# Patient Record
Sex: Female | Born: 1975 | Race: White | Hispanic: No | Marital: Married | Smoking: Never smoker
Health system: Southern US, Community
[De-identification: ages and names within clinical notes are randomized; demographics above are authoritative.]

## PROBLEM LIST (undated history)

## (undated) DIAGNOSIS — I10 Essential (primary) hypertension: Secondary | ICD-10-CM

## (undated) DIAGNOSIS — N970 Female infertility associated with anovulation: Secondary | ICD-10-CM

## (undated) DIAGNOSIS — K635 Polyp of colon: Secondary | ICD-10-CM

## (undated) HISTORY — DX: Female infertility associated with anovulation: N97.0

## (undated) HISTORY — DX: Polyp of colon: K63.5

## (undated) HISTORY — PX: OTHER SURGICAL HISTORY: SHX169

---

## 2006-10-27 ENCOUNTER — Emergency Department (HOSPITAL_COMMUNITY): Admission: EM | Admit: 2006-10-27 | Discharge: 2006-10-27 | Payer: Self-pay | Admitting: Emergency Medicine

## 2010-06-22 LAB — ANTIBODY SCREEN: Antibody Screen: NEGATIVE

## 2010-06-22 LAB — ABO/RH: RH Type: POSITIVE

## 2010-06-22 LAB — GC/CHLAMYDIA PROBE AMP, GENITAL
Chlamydia: NEGATIVE
Gonorrhea: NEGATIVE

## 2010-06-22 LAB — HIV ANTIBODY (ROUTINE TESTING W REFLEX): HIV: NONREACTIVE

## 2010-08-25 ENCOUNTER — Other Ambulatory Visit: Payer: Self-pay | Admitting: Obstetrics and Gynecology

## 2010-08-25 DIAGNOSIS — O09529 Supervision of elderly multigravida, unspecified trimester: Secondary | ICD-10-CM

## 2010-09-17 ENCOUNTER — Ambulatory Visit (HOSPITAL_COMMUNITY): Payer: BC Managed Care – PPO

## 2010-09-17 ENCOUNTER — Encounter (HOSPITAL_COMMUNITY): Payer: BC Managed Care – PPO

## 2010-09-17 ENCOUNTER — Other Ambulatory Visit: Payer: Self-pay | Admitting: Obstetrics and Gynecology

## 2010-09-17 ENCOUNTER — Ambulatory Visit (HOSPITAL_COMMUNITY)
Admission: RE | Admit: 2010-09-17 | Discharge: 2010-09-17 | Disposition: A | Discharge status: Home / Self Care | Payer: BC Managed Care – PPO | Source: Ambulatory Visit | Attending: Obstetrics and Gynecology | Admitting: Obstetrics and Gynecology

## 2010-09-17 ENCOUNTER — Other Ambulatory Visit (HOSPITAL_COMMUNITY): Payer: BC Managed Care – PPO

## 2010-09-17 DIAGNOSIS — Z1389 Encounter for screening for other disorder: Secondary | ICD-10-CM | POA: Insufficient documentation

## 2010-09-17 DIAGNOSIS — Z0489 Encounter for examination and observation for other specified reasons: Secondary | ICD-10-CM

## 2010-09-17 DIAGNOSIS — O358XX0 Maternal care for other (suspected) fetal abnormality and damage, not applicable or unspecified: Secondary | ICD-10-CM | POA: Insufficient documentation

## 2010-09-17 DIAGNOSIS — O09529 Supervision of elderly multigravida, unspecified trimester: Secondary | ICD-10-CM

## 2010-09-17 DIAGNOSIS — Z363 Encounter for antenatal screening for malformations: Secondary | ICD-10-CM | POA: Insufficient documentation

## 2010-09-17 DIAGNOSIS — O99210 Obesity complicating pregnancy, unspecified trimester: Secondary | ICD-10-CM

## 2010-10-15 ENCOUNTER — Other Ambulatory Visit: Payer: Self-pay | Admitting: Obstetrics and Gynecology

## 2010-10-15 ENCOUNTER — Ambulatory Visit (HOSPITAL_COMMUNITY)
Admission: RE | Admit: 2010-10-15 | Discharge: 2010-10-15 | Disposition: A | Discharge status: Home / Self Care | Payer: BC Managed Care – PPO | Source: Ambulatory Visit | Attending: Obstetrics and Gynecology | Admitting: Obstetrics and Gynecology

## 2010-10-15 DIAGNOSIS — E669 Obesity, unspecified: Secondary | ICD-10-CM | POA: Insufficient documentation

## 2010-10-15 DIAGNOSIS — O99210 Obesity complicating pregnancy, unspecified trimester: Secondary | ICD-10-CM

## 2010-10-15 DIAGNOSIS — Z0489 Encounter for examination and observation for other specified reasons: Secondary | ICD-10-CM

## 2010-10-15 DIAGNOSIS — Z3689 Encounter for other specified antenatal screening: Secondary | ICD-10-CM | POA: Insufficient documentation

## 2010-11-05 ENCOUNTER — Other Ambulatory Visit: Payer: Self-pay | Admitting: Obstetrics and Gynecology

## 2010-11-05 ENCOUNTER — Ambulatory Visit (HOSPITAL_COMMUNITY)
Admission: RE | Admit: 2010-11-05 | Discharge: 2010-11-05 | Disposition: A | Discharge status: Home / Self Care | Payer: BC Managed Care – PPO | Source: Ambulatory Visit | Attending: Obstetrics and Gynecology | Admitting: Obstetrics and Gynecology

## 2010-11-05 DIAGNOSIS — Z3689 Encounter for other specified antenatal screening: Secondary | ICD-10-CM | POA: Insufficient documentation

## 2010-12-03 ENCOUNTER — Ambulatory Visit (HOSPITAL_COMMUNITY)
Admission: RE | Admit: 2010-12-03 | Discharge: 2010-12-03 | Disposition: A | Discharge status: Home / Self Care | Payer: BC Managed Care – PPO | Source: Ambulatory Visit | Attending: Obstetrics and Gynecology | Admitting: Obstetrics and Gynecology

## 2010-12-03 ENCOUNTER — Encounter (HOSPITAL_COMMUNITY): Payer: Self-pay

## 2010-12-03 ENCOUNTER — Other Ambulatory Visit: Payer: Self-pay | Admitting: Obstetrics and Gynecology

## 2010-12-03 DIAGNOSIS — Z3689 Encounter for other specified antenatal screening: Secondary | ICD-10-CM | POA: Insufficient documentation

## 2010-12-03 DIAGNOSIS — O99210 Obesity complicating pregnancy, unspecified trimester: Secondary | ICD-10-CM | POA: Insufficient documentation

## 2010-12-03 DIAGNOSIS — E669 Obesity, unspecified: Secondary | ICD-10-CM | POA: Insufficient documentation

## 2011-02-24 ENCOUNTER — Telehealth (HOSPITAL_COMMUNITY): Payer: Self-pay | Admitting: *Deleted

## 2011-02-24 ENCOUNTER — Encounter (HOSPITAL_COMMUNITY): Payer: Self-pay | Admitting: *Deleted

## 2011-02-24 NOTE — Telephone Encounter (Signed)
Preadmission screen  

## 2011-02-25 ENCOUNTER — Encounter (HOSPITAL_COMMUNITY): Payer: Self-pay | Admitting: *Deleted

## 2011-02-27 ENCOUNTER — Encounter (HOSPITAL_COMMUNITY): Payer: Self-pay

## 2011-02-27 ENCOUNTER — Inpatient Hospital Stay (HOSPITAL_COMMUNITY)
Admission: RE | Admit: 2011-02-27 | Discharge: 2011-03-03 | DRG: 371 | Disposition: A | Discharge status: Home / Self Care | Payer: BC Managed Care – PPO | Source: Ambulatory Visit | Attending: Obstetrics and Gynecology | Admitting: Obstetrics and Gynecology

## 2011-02-27 ENCOUNTER — Encounter: Payer: Self-pay | Admitting: Obstetrics and Gynecology

## 2011-02-27 DIAGNOSIS — Z98891 History of uterine scar from previous surgery: Secondary | ICD-10-CM

## 2011-02-27 DIAGNOSIS — O99892 Other specified diseases and conditions complicating childbirth: Secondary | ICD-10-CM | POA: Diagnosis present

## 2011-02-27 DIAGNOSIS — O481 Prolonged pregnancy: Secondary | ICD-10-CM

## 2011-02-27 DIAGNOSIS — O324XX0 Maternal care for high head at term, not applicable or unspecified: Secondary | ICD-10-CM | POA: Diagnosis present

## 2011-02-27 DIAGNOSIS — O09519 Supervision of elderly primigravida, unspecified trimester: Secondary | ICD-10-CM | POA: Diagnosis present

## 2011-02-27 DIAGNOSIS — Z22330 Carrier of Group B streptococcus: Secondary | ICD-10-CM

## 2011-02-27 DIAGNOSIS — O480 Post-term pregnancy: Principal | ICD-10-CM | POA: Diagnosis present

## 2011-02-27 DIAGNOSIS — E669 Obesity, unspecified: Secondary | ICD-10-CM | POA: Diagnosis present

## 2011-02-27 LAB — CBC
Hemoglobin: 12.3 g/dL (ref 12.0–15.0)
MCHC: 32.6 g/dL (ref 30.0–36.0)
RDW: 14.4 % (ref 11.5–15.5)
WBC: 8.9 10*3/uL (ref 4.0–10.5)

## 2011-02-27 MED ORDER — TERBUTALINE SULFATE 1 MG/ML IJ SOLN: 0.2500 mg | Freq: Once | INTRAMUSCULAR | Status: AC | PRN

## 2011-02-27 MED ORDER — OXYTOCIN 20 UNITS IN LACTATED RINGERS INFUSION - SIMPLE
1.0000 m[IU]/min | INTRAVENOUS | Status: DC
  Administered 2011-02-28: 2 m[IU]/min via INTRAVENOUS
  Administered 2011-02-28: 6 m[IU]/min via INTRAVENOUS
  Administered 2011-02-28: 8 m[IU]/min via INTRAVENOUS
  Filled 2011-02-27: qty 1000

## 2011-02-27 MED ORDER — CITRIC ACID-SODIUM CITRATE 334-500 MG/5ML PO SOLN
30.0000 mL | ORAL | Status: DC | PRN
  Administered 2011-02-28: 30 mL via ORAL
  Filled 2011-02-27: qty 15

## 2011-02-27 MED ORDER — LIDOCAINE HCL (PF) 1 % IJ SOLN
30.0000 mL | INTRAMUSCULAR | Status: DC | PRN
  Filled 2011-02-27: qty 30

## 2011-02-27 MED ORDER — ZOLPIDEM TARTRATE 10 MG PO TABS
10.0000 mg | ORAL_TABLET | Freq: Every evening | ORAL | Status: DC | PRN
  Administered 2011-02-27: 10 mg via ORAL
  Filled 2011-02-27: qty 1

## 2011-02-27 MED ORDER — LACTATED RINGERS IV SOLN
INTRAVENOUS | Status: DC
  Administered 2011-02-27 – 2011-02-28 (×3): via INTRAVENOUS
  Administered 2011-02-28: 999 mL/h via INTRAVENOUS
  Administered 2011-02-28: 22:00:00 via INTRAVENOUS
  Administered 2011-02-28: 300 mL via INTRAVENOUS
  Administered 2011-02-28: 05:00:00 via INTRAVENOUS

## 2011-02-27 MED ORDER — HYDROXYZINE HCL 50 MG/ML IM SOLN
50.0000 mg | Freq: Four times a day (QID) | INTRAMUSCULAR | Status: DC | PRN
  Filled 2011-02-27: qty 1

## 2011-02-27 MED ORDER — ONDANSETRON HCL 4 MG/2ML IJ SOLN: 4.0000 mg | Freq: Four times a day (QID) | INTRAMUSCULAR | Status: DC | PRN

## 2011-02-27 MED ORDER — BUTORPHANOL TARTRATE 2 MG/ML IJ SOLN: 1.0000 mg | INTRAMUSCULAR | Status: DC | PRN

## 2011-02-27 MED ORDER — ACETAMINOPHEN 325 MG PO TABS: 650.0000 mg | ORAL_TABLET | ORAL | Status: DC | PRN

## 2011-02-27 MED ORDER — OXYTOCIN 20 UNITS IN LACTATED RINGERS INFUSION - SIMPLE: 125.0000 mL/h | Freq: Once | INTRAVENOUS | Status: DC

## 2011-02-27 MED ORDER — LACTATED RINGERS IV SOLN: 500.0000 mL | INTRAVENOUS | Status: DC | PRN

## 2011-02-27 MED ORDER — PENICILLIN G POTASSIUM 5000000 UNITS IJ SOLR
5.0000 10*6.[IU] | Freq: Once | INTRAVENOUS | Status: AC
  Administered 2011-02-27: 5 10*6.[IU] via INTRAVENOUS
  Filled 2011-02-27: qty 5

## 2011-02-27 MED ORDER — IBUPROFEN 600 MG PO TABS: 600.0000 mg | ORAL_TABLET | Freq: Four times a day (QID) | ORAL | Status: DC | PRN

## 2011-02-27 MED ORDER — OXYCODONE-ACETAMINOPHEN 5-325 MG PO TABS: 2.0000 | ORAL_TABLET | ORAL | Status: DC | PRN

## 2011-02-27 MED ORDER — OXYTOCIN BOLUS FROM INFUSION
500.0000 mL | Freq: Once | INTRAVENOUS | Status: DC
  Filled 2011-02-27: qty 500

## 2011-02-27 MED ORDER — FLEET ENEMA 7-19 GM/118ML RE ENEM: 1.0000 | ENEMA | RECTAL | Status: DC | PRN

## 2011-02-27 MED ORDER — PENICILLIN G POTASSIUM 5000000 UNITS IJ SOLR
2.5000 10*6.[IU] | INTRAVENOUS | Status: DC
  Administered 2011-02-28 (×5): 2.5 10*6.[IU] via INTRAVENOUS
  Filled 2011-02-27 (×10): qty 2.5

## 2011-02-27 MED ORDER — HYDROXYZINE HCL 50 MG PO TABS
50.0000 mg | ORAL_TABLET | Freq: Four times a day (QID) | ORAL | Status: DC | PRN
  Filled 2011-02-27: qty 1

## 2011-02-27 NOTE — H&P (Signed)
Kristina Burgess is an 35 y.o. female. G1P0, EDC 10/5.  Prenatal course noted for AMA, clomid conception, obesity and positive GBS. Weekly NSTs were done until last week when it became impossible to monitor the baby externally due to the enlarged abdomen.  A biophysical profile done on 10/11 was 8/8.  EFW was at the 38th percentile and was 7lb and 7oz.  Pertinent Gynecological History: Menses: Flow is moderate and occurs every 35-42 days. Bleeding: normal Contraception: none DES exposure: unknown Blood transfusions: none Sexually transmitted diseases: no past history Previous GYN Procedures: none  Last mammogram: na Date: na Last pap: normal Date: 04/15/2009 OB History: G1, P0   Menstrual History: Menarche age: 54 Patient's last menstrual period was 05/14/2010.    Past Medical History  Diagnosis Date  . Colon polyp     removed 4-5 years ago  . Infertility associated with anovulation     was on clomid    Past Surgical History  Procedure Date  . Gi polyp removal     4-5 years ago    Family History  Problem Relation Age of Onset  . Heart disease Paternal Grandmother     pacemaker  . Anesthesia problems Neg Hx   . Hypotension Neg Hx   . Malignant hyperthermia Neg Hx   . Pseudochol deficiency Neg Hx     Social History:  reports that she has never smoked. She has never used smokeless tobacco. She reports that she does not drink alcohol or use illicit drugs.  Allergies: No Known Allergies   (Not in a hospital admission)  Review of Systems  Constitutional: Negative.   HENT: Negative.   Eyes: Negative.   Respiratory: Negative.   Cardiovascular: Negative.   Gastrointestinal: Negative.   Genitourinary: Negative.   Musculoskeletal: Negative.   Skin: Negative.   Neurological: Negative.   Endo/Heme/Allergies: Negative.   Psychiatric/Behavioral: Negative.     Last menstrual period 05/14/2010. Physical Exam  Constitutional: She appears well-developed and well-nourished.  No distress.  HENT:  Head: Normocephalic.  Eyes: Right eye exhibits no discharge. Left eye exhibits no discharge. No scleral icterus.  Neck: Normal range of motion. No tracheal deviation present. No thyromegaly present.  Cardiovascular: Normal rate, regular rhythm, normal heart sounds and intact distal pulses.  Exam reveals no gallop and no friction rub.   No murmur heard. Respiratory: Effort normal and breath sounds normal. No respiratory distress. She has no wheezes. She has no rales. She exhibits no tenderness.  GI: Soft. Bowel sounds are normal. She exhibits no distension. There is no tenderness. There is no rebound and no guarding.       Gravid uterus, term  Genitourinary: Vagina normal. No vaginal discharge found.  Musculoskeletal: Normal range of motion. She exhibits no edema and no tenderness.  Lymphadenopathy:    She has no cervical adenopathy.  Neurological: She is alert. She has normal reflexes.  Skin: Skin is warm and dry.  Psychiatric: She has a normal mood and affect. Her behavior is normal. Judgment and thought content normal.    No results found for this or any previous visit (from the past 24 hour(s)).  No results found.  Assessment/Plan:  41 2/7 weeks AMA Obesity Positive GBS Clomid conception  Pt requests trial of labor due to postdates. I have discussed the risks, benefits, possible complications including increased Cesarean section risks.  Informed consent given for pitocin induction.  Plan foley balloon insertion to ripen cervix.    Fortino Sic 02/27/2011, 8:22 PM

## 2011-02-28 ENCOUNTER — Encounter (HOSPITAL_COMMUNITY): Payer: Self-pay | Admitting: Anesthesiology

## 2011-02-28 ENCOUNTER — Encounter (HOSPITAL_COMMUNITY)
Admission: RE | Disposition: A | Discharge status: Home / Self Care | Payer: Self-pay | Source: Ambulatory Visit | Attending: Obstetrics and Gynecology

## 2011-02-28 ENCOUNTER — Inpatient Hospital Stay (HOSPITAL_COMMUNITY): Payer: BC Managed Care – PPO | Admitting: Anesthesiology

## 2011-02-28 ENCOUNTER — Encounter (HOSPITAL_COMMUNITY): Payer: Self-pay

## 2011-02-28 DIAGNOSIS — O480 Post-term pregnancy: Secondary | ICD-10-CM

## 2011-02-28 LAB — TYPE AND SCREEN
ABO/RH(D): O POS
Antibody Screen: NEGATIVE

## 2011-02-28 LAB — ABO/RH: ABO/RH(D): O POS

## 2011-02-28 LAB — RPR: RPR Ser Ql: NONREACTIVE

## 2011-02-28 SURGERY — Surgical Case
Anesthesia: Epidural | Site: Abdomen | Wound class: Clean Contaminated

## 2011-02-28 MED ORDER — MORPHINE SULFATE (PF) 0.5 MG/ML IJ SOLN
INTRAMUSCULAR | Status: DC | PRN
  Administered 2011-02-28: 4 mg via EPIDURAL
  Administered 2011-02-28: 1 mg via EPIDURAL

## 2011-02-28 MED ORDER — SCOPOLAMINE 1 MG/3DAYS TD PT72
1.0000 | MEDICATED_PATCH | Freq: Once | TRANSDERMAL | Status: DC
  Administered 2011-02-28: 1.5 mg via TRANSDERMAL

## 2011-02-28 MED ORDER — LIDOCAINE HCL 1.5 % IJ SOLN
INTRAMUSCULAR | Status: DC | PRN
  Administered 2011-02-28: 2 mL via INTRADERMAL
  Administered 2011-02-28 (×2): 5 mL via INTRADERMAL

## 2011-02-28 MED ORDER — LIDOCAINE-EPINEPHRINE (PF) 2 %-1:200000 IJ SOLN
INTRAMUSCULAR | Status: AC
  Filled 2011-02-28: qty 20

## 2011-02-28 MED ORDER — LACTATED RINGERS IV SOLN: 500.0000 mL | Freq: Once | INTRAVENOUS | Status: DC

## 2011-02-28 MED ORDER — EPHEDRINE 5 MG/ML INJ
10.0000 mg | INTRAVENOUS | Status: DC | PRN
  Filled 2011-02-28: qty 4

## 2011-02-28 MED ORDER — OXYTOCIN 20 UNITS IN LACTATED RINGERS INFUSION - SIMPLE
INTRAVENOUS | Status: AC
  Filled 2011-02-28: qty 1000

## 2011-02-28 MED ORDER — METOCLOPRAMIDE HCL 5 MG/ML IJ SOLN: 10.0000 mg | Freq: Once | INTRAMUSCULAR | Status: AC | PRN

## 2011-02-28 MED ORDER — MORPHINE SULFATE 0.5 MG/ML IJ SOLN
INTRAMUSCULAR | Status: AC
  Filled 2011-02-28: qty 10

## 2011-02-28 MED ORDER — FENTANYL CITRATE 0.05 MG/ML IJ SOLN: 25.0000 ug | INTRAMUSCULAR | Status: DC | PRN

## 2011-02-28 MED ORDER — FENTANYL 2.5 MCG/ML BUPIVACAINE 1/10 % EPIDURAL INFUSION (WH - ANES)
14.0000 mL/h | INTRAMUSCULAR | Status: DC
  Administered 2011-02-28 (×3): 14 mL/h via EPIDURAL
  Filled 2011-02-28 (×3): qty 60

## 2011-02-28 MED ORDER — CEFAZOLIN SODIUM 1-5 GM-% IV SOLN
INTRAVENOUS | Status: AC
  Filled 2011-02-28: qty 100

## 2011-02-28 MED ORDER — PHENYLEPHRINE 40 MCG/ML (10ML) SYRINGE FOR IV PUSH (FOR BLOOD PRESSURE SUPPORT)
80.0000 ug | PREFILLED_SYRINGE | INTRAVENOUS | Status: DC | PRN
  Filled 2011-02-28 (×2): qty 5

## 2011-02-28 MED ORDER — DIPHENHYDRAMINE HCL 50 MG/ML IJ SOLN: 12.5000 mg | INTRAMUSCULAR | Status: DC | PRN

## 2011-02-28 MED ORDER — OXYTOCIN 10 UNIT/ML IJ SOLN
INTRAMUSCULAR | Status: AC
  Filled 2011-02-28: qty 2

## 2011-02-28 MED ORDER — SODIUM BICARBONATE 8.4 % IV SOLN
INTRAVENOUS | Status: DC | PRN
  Administered 2011-02-28: 10 mL via EPIDURAL

## 2011-02-28 MED ORDER — CEFAZOLIN SODIUM-DEXTROSE 2-3 GM-% IV SOLR
2.0000 g | Freq: Once | INTRAVENOUS | Status: AC
  Administered 2011-02-28: 2 g via INTRAVENOUS

## 2011-02-28 MED ORDER — SODIUM BICARBONATE 8.4 % IV SOLN
INTRAVENOUS | Status: AC
  Filled 2011-02-28: qty 50

## 2011-02-28 MED ORDER — SCOPOLAMINE 1 MG/3DAYS TD PT72
MEDICATED_PATCH | TRANSDERMAL | Status: AC
  Filled 2011-02-28: qty 1

## 2011-02-28 MED ORDER — KETOROLAC TROMETHAMINE 60 MG/2ML IM SOLN
60.0000 mg | Freq: Once | INTRAMUSCULAR | Status: AC | PRN
  Administered 2011-02-28: 60 mg via INTRAMUSCULAR

## 2011-02-28 MED ORDER — PHENYLEPHRINE HCL 10 MG/ML IJ SOLN
INTRAMUSCULAR | Status: DC | PRN
  Administered 2011-02-28: 40 ug via INTRAVENOUS

## 2011-02-28 MED ORDER — KETOROLAC TROMETHAMINE 60 MG/2ML IM SOLN
INTRAMUSCULAR | Status: AC
  Filled 2011-02-28: qty 2

## 2011-02-28 MED ORDER — ONDANSETRON HCL 4 MG/2ML IJ SOLN
INTRAMUSCULAR | Status: AC
Start: 2011-02-28 — End: 2011-02-28
  Filled 2011-02-28: qty 2

## 2011-02-28 MED ORDER — ONDANSETRON HCL 4 MG/2ML IJ SOLN
INTRAMUSCULAR | Status: DC | PRN
  Administered 2011-02-28: 4 mg via INTRAVENOUS

## 2011-02-28 MED ORDER — PHENYLEPHRINE 40 MCG/ML (10ML) SYRINGE FOR IV PUSH (FOR BLOOD PRESSURE SUPPORT)
PREFILLED_SYRINGE | INTRAVENOUS | Status: AC
  Filled 2011-02-28: qty 5

## 2011-02-28 MED ORDER — OXYTOCIN 20 UNITS IN LACTATED RINGERS INFUSION - SIMPLE
125.0000 mL/h | INTRAVENOUS | Status: AC
  Administered 2011-02-28: 125 mL/h via INTRAVENOUS

## 2011-02-28 MED ORDER — EPHEDRINE 5 MG/ML INJ
10.0000 mg | INTRAVENOUS | Status: DC | PRN
  Filled 2011-02-28 (×2): qty 4

## 2011-02-28 MED ORDER — PHENYLEPHRINE 40 MCG/ML (10ML) SYRINGE FOR IV PUSH (FOR BLOOD PRESSURE SUPPORT)
80.0000 ug | PREFILLED_SYRINGE | INTRAVENOUS | Status: DC | PRN
  Administered 2011-02-28 (×2): 80 ug via INTRAVENOUS
  Filled 2011-02-28: qty 5

## 2011-02-28 MED ORDER — MEPERIDINE HCL 25 MG/ML IJ SOLN
6.2500 mg | INTRAMUSCULAR | Status: DC | PRN
  Administered 2011-03-01: 12.5 mg via INTRAVENOUS

## 2011-02-28 MED ORDER — OXYTOCIN 20 UNITS IN LACTATED RINGERS INFUSION - SIMPLE
INTRAVENOUS | Status: DC | PRN
  Administered 2011-02-28 (×2): 20 [IU] via INTRAVENOUS

## 2011-02-28 SURGICAL SUPPLY — 35 items
ADH SKN CLS APL DERMABOND .7 (GAUZE/BANDAGES/DRESSINGS)
CLOTH BEACON ORANGE TIMEOUT ST (SAFETY) ×2 IMPLANT
DERMABOND ADVANCED (GAUZE/BANDAGES/DRESSINGS)
DERMABOND ADVANCED .7 DNX12 (GAUZE/BANDAGES/DRESSINGS) IMPLANT
DRESSING TELFA 8X3 (GAUZE/BANDAGES/DRESSINGS) ×1 IMPLANT
DRSG PAD ABDOMINAL 8X10 ST (GAUZE/BANDAGES/DRESSINGS) ×1 IMPLANT
DURAPREP 26ML APPLICATOR (WOUND CARE) ×2 IMPLANT
ELECT REM PT RETURN 9FT ADLT (ELECTROSURGICAL) ×2
ELECTRODE REM PT RTRN 9FT ADLT (ELECTROSURGICAL) ×1 IMPLANT
EXTRACTOR VACUUM M CUP 4 TUBE (SUCTIONS) IMPLANT
GAUZE SPONGE 4X4 12PLY STRL LF (GAUZE/BANDAGES/DRESSINGS) IMPLANT
GLOVE BIO SURGEON STRL SZ 6.5 (GLOVE) ×2 IMPLANT
GLOVE BIOGEL PI IND STRL 7.0 (GLOVE) ×1 IMPLANT
GLOVE BIOGEL PI INDICATOR 7.0 (GLOVE) ×1
GOWN PREVENTION PLUS LG XLONG (DISPOSABLE) ×3 IMPLANT
GOWN PREVENTION PLUS XLARGE (GOWN DISPOSABLE) ×3 IMPLANT
KIT ABG SYR 3ML LUER SLIP (SYRINGE) IMPLANT
NDL HYPO 25X5/8 SAFETYGLIDE (NEEDLE) IMPLANT
NEEDLE HYPO 25X5/8 SAFETYGLIDE (NEEDLE) IMPLANT
NS IRRIG 1000ML POUR BTL (IV SOLUTION) ×2 IMPLANT
PACK C SECTION WH (CUSTOM PROCEDURE TRAY) ×2 IMPLANT
PAD ABD 7.5X8 STRL (GAUZE/BANDAGES/DRESSINGS) IMPLANT
RTRCTR C-SECT PINK 25CM LRG (MISCELLANEOUS) IMPLANT
SLEEVE SCD COMPRESS KNEE MED (MISCELLANEOUS) IMPLANT
SPONGE GAUZE 4X4 12PLY (GAUZE/BANDAGES/DRESSINGS) ×2 IMPLANT
SUT CHROMIC 2 0 CT 1 (SUTURE) ×2 IMPLANT
SUT PLAIN 2 0 (SUTURE)
SUT PLAIN 2 0 XLH (SUTURE) ×2 IMPLANT
SUT PLAIN ABS 2-0 54XMFL TIE (SUTURE) IMPLANT
SUT VIC AB 0 CT1 36 (SUTURE) ×8 IMPLANT
SUT VIC AB 4-0 KS 27 (SUTURE) ×2 IMPLANT
TAPE CLOTH SURG 4X10 WHT LF (GAUZE/BANDAGES/DRESSINGS) ×1 IMPLANT
TOWEL OR 17X24 6PK STRL BLUE (TOWEL DISPOSABLE) ×4 IMPLANT
TRAY FOLEY CATH 14FR (SET/KITS/TRAYS/PACK) IMPLANT
WATER STERILE IRR 1000ML POUR (IV SOLUTION) ×1 IMPLANT

## 2011-02-28 NOTE — Anesthesia Postprocedure Evaluation (Signed)
  Anesthesia Post-op Note  Patient: Kristina Burgess  Procedure(s) Performed:  CESAREAN SECTION  Patient Location: PACU  Anesthesia Type: Epidural  Level of Consciousness: awake, alert  and oriented  Airway and Oxygen Therapy: Patient Spontanous Breathing  Post-op Pain: none  Post-op Assessment: Post-op Vital signs reviewed, Patient's Cardiovascular Status Stable, Respiratory Function Stable, Patent Airway, No signs of Nausea or vomiting, Pain level controlled, No headache, No backache, No residual numbness and No residual motor weakness  Post-op Vital Signs: Reviewed and stable  Complications: No apparent anesthesia complications

## 2011-02-28 NOTE — Anesthesia Postprocedure Evaluation (Signed)
  Anesthesia Post-op Note  Patient: Kristina Burgess  Procedure(s) Performed: Patient for urgent Cesarean Section for failure to descend. Taken from Rm 160 to OR 1 for procedure.

## 2011-02-28 NOTE — Anesthesia Preprocedure Evaluation (Addendum)
Anesthesia Evaluation  Name, MR# and DOB Patient awake  General Assessment Comment  Reviewed: Allergy & Precautions, H&P , NPO status , Patient's Chart, lab work & pertinent test results, reviewed documented beta blocker date and time   History of Anesthesia Complications Negative for: history of anesthetic complications  Airway Mallampati: III TM Distance: >3 FB Neck ROM: full    Dental  (+) Teeth Intact   Pulmonary  clear to auscultation        Cardiovascular regular Normal    Neuro/Psych Negative Neurological ROS  Negative Psych ROS   GI/Hepatic negative GI ROS Neg liver ROS    Endo/Other  Morbid obesity  Renal/GU negative Renal ROS     Musculoskeletal   Abdominal   Peds  Hematology negative hematology ROS (+)   Anesthesia Other Findings   Reproductive/Obstetrics (+) Pregnancy                           Anesthesia Physical Anesthesia Plan  ASA: III and Emergent  Anesthesia Plan: Epidural   Post-op Pain Management:    Induction:   Airway Management Planned:   Additional Equipment:   Intra-op Plan:   Post-operative Plan:   Informed Consent: I have reviewed the patients History and Physical, chart, labs and discussed the procedure including the risks, benefits and alternatives for the proposed anesthesia with the patient or authorized representative who has indicated his/her understanding and acceptance.     Plan Discussed with: CRNA, Anesthesiologist and Surgeon  Anesthesia Plan Comments:        Anesthesia Quick Evaluation

## 2011-02-28 NOTE — Transfer of Care (Signed)
Immediate Anesthesia Transfer of Care Note  Patient: Kristina Burgess  Procedure(s) Performed:  CESAREAN SECTION  Patient Location: PACU  Anesthesia Type: Epidural  Level of Consciousness: awake, alert , oriented and patient cooperative  Airway & Oxygen Therapy: Patient Spontanous Breathing  Post-op Assessment: Report given to PACU RN and Post -op Vital signs reviewed and stable  Post vital signs: Reviewed and stable  Complications: No apparent anesthesia complications

## 2011-02-28 NOTE — Progress Notes (Signed)
Labor Note:    Patient is beginning to have increased pain with contractions.  Has been on PCN, s/p 3+ doses of PCN.  Presented for induction of labor at 41+ weeks.  No other complaints.    AFVSS 4/60/-3.  Head well applied to cervix.  Not ballotable. Reactive and reassuring fetal status. Toco:  q 2 min.  AROM:  After vaginal exam was performed revealed the above findings, AROM was performed.  After the [facility] was used to rupture membranes, fundal pressure used to ensure continued application of head on cervix.  Smell amount of meconium stained fluid was obtained.  Fetus tolerated the procedure well.  Thereafter, an IUPC was placed.  Several contractions viewed and MVU's appeared to be appropriate on 20 mu's of pitocin.  Will continue to monitor.  A/P:  Induc. Of labor in morbidly obese female with post-dates pregnancy.  Continue present expectant management.

## 2011-02-28 NOTE — Anesthesia Procedure Notes (Addendum)
Epidural Patient location during procedure: OB Start time: 02/28/2011 1:57 PM Reason for block: procedure for pain  Staffing Performed by: anesthesiologist   Preanesthetic Checklist Completed: patient identified, site marked, surgical consent, pre-op evaluation, timeout performed, IV checked, risks and benefits discussed and monitors and equipment checked  Epidural Patient position: sitting Prep: site prepped and draped and DuraPrep Patient monitoring: continuous pulse ox and blood pressure Approach: midline Injection technique: LOR air  Needle:  Needle type: Tuohy  Needle gauge: 17 G Needle length: 9 cm Needle insertion depth: 9 cm Catheter type: closed end flexible Catheter size: 19 Gauge Catheter at skin depth: 14 cm Test dose: negative  Assessment Events: blood not aspirated, injection not painful, no injection resistance, negative IV test and no paresthesia  Additional Notes Discussed risk of headache, infection, bleeding, nerve injury and failed or incomplete block.  Patient voices understanding and wishes to proceed.

## 2011-02-28 NOTE — Progress Notes (Signed)
Labor Note:   Patient is comfortable with epidural.  No complaints.  Patient had decels and hypotension s/p epidural placement and pitocin was turned off.  Nurse later restarted pitocin and has been increasing the dose incrementally since, now at 8 mu.  MVU's were adequate s/p AROM and prior to epidural and at that time, there was cervical change.  Since then, however, not adequate and also no cervical change.  MVU's currently at 170-180.  Will continue to increase pitocin in order to obtain adequate MVU's.    AFVSS. 5/80/-2/mild caput. Toco:  q 2-3 minutes.   + accels, no current decels, + variability.  A/P:  Continue induction with pitocin for postdates.  Unable to diagnose arrest disorder at present given no extended time with adequate ctx.  Mild caput and lack of cervical change, however, concerning for impending arrest.  This was discussed with patient.  Continue PCN for + GBS.

## 2011-02-28 NOTE — Anesthesia Preprocedure Evaluation (Signed)
Anesthesia Evaluation  Name, MR# and DOB Patient awake  General Assessment Comment  Reviewed: Allergy & Precautions, H&P , NPO status , Patient's Chart, lab work & pertinent test results, reviewed documented beta blocker date and time   History of Anesthesia Complications Negative for: history of anesthetic complications  Airway Mallampati: III TM Distance: >3 FB Neck ROM: full    Dental  (+) Teeth Intact   Pulmonary  clear to auscultation  Pulmonary exam normal       Cardiovascular regular Normal    Neuro/Psych Negative Neurological ROS  Negative Psych ROS   GI/Hepatic negative GI ROS Neg liver ROS    Endo/Other  Morbid obesity  Renal/GU negative Renal ROS     Musculoskeletal   Abdominal   Peds  Hematology negative hematology ROS (+)   Anesthesia Other Findings   Reproductive/Obstetrics (+) Pregnancy                           Anesthesia Physical  Anesthesia Plan  ASA: III and Emergent  Anesthesia Plan: Epidural   Post-op Pain Management:    Induction:   Airway Management Planned:   Additional Equipment:   Intra-op Plan:   Post-operative Plan:   Informed Consent: I have reviewed the patients History and Physical, chart, labs and discussed the procedure including the risks, benefits and alternatives for the proposed anesthesia with the patient or authorized representative who has indicated his/her understanding and acceptance.     Plan Discussed with: CRNA, Anesthesiologist and Surgeon  Anesthesia Plan Comments:         Anesthesia Quick Evaluation

## 2011-02-28 NOTE — Op Note (Signed)
Cesarean Section Procedure Note   Kristina Burgess   02/28/2011  Indications: 42 weeks, failed induction, failure to descend, failure to progress positive GBS, maternal obesity  35 yo G1, induced due to post dates.  Prenatal care noted for clomid conception.  Induction was started with a foley balloon insertion into the cervix followed by pitocin induction.  Labor progressed slowly and pitocin had to be stopped due to late decelerations which recovered when the pitocin was stopped.  Cervix progressed to 7cm dilation but the head did not descend past minus two.  The situation was discussed with the patient and her partner and informed consent was given for Cesarean section.  Pre-operative Diagnosis: Failure to Progress.   Post-operative Diagnosis: Same   Surgeon: Fortino Sic  Assistants: none  Anesthesia: epidural  Procedure Details:  The patient was seen in the Holding Room. The risks, benefits, complications, treatment options, and expected outcomes were discussed with the patient. The patient concurred with the proposed plan, giving informed consent. The patient was identified as [name] and the procedure verified as C-Section Delivery. A Time Out was held and the above information confirmed.  After induction of anesthesia, the patient was draped and prepped in the usual sterile manner. A transverse incision was made and carried down through the subcutaneous tissue to the fascia. The fascial incision was made and extended transversely. The fascia was separated from the underlying rectus tissue superiorly and inferiorly. The peritoneum was identified and entered. The peritoneal incision was extended longitudinally. The utero-vesical peritoneal reflection was incised transversely and the bladder flap was bluntly freed from the lower uterine segment. A low transverse uterine incision was made. Delivered from cephalic presentation was a 7 lb 6 oz female living newborn s) with Apgar scores of 8  and 9 at onr minute and 5 minutes.. A cord ph was  not sent. The umbilical cord was clamped and cut cord. A sample was obtained for evaluation. The placenta was removed Intact and appeared normal.  The uterine incision was closed with running locked sutures of 1-0 vicryl. A second imbricating layer of the same suture was placed.  Hemostasis was observed. The paracolic gutters were irrigated. The fascia was then re approximated with running sutures of 1-vicryl. The subcuticular closure was performed using 2-0 plain. The skin was approximated with 4 0 vicryl in a subcuticular fashion.  Instrument, sponge, and needle counts were correct prior the abdominal closure and were correct at the conclusion of the case.    Findings:  viable female infant   Estimated Blood Loss: 750 cc   Total IV Fluids: see anesthesia record  Urine Output:  See anesthesia record  Specimens: Placenta to Labor and delivery Specimens    None       Complications: no complications  Disposition: PACU - hemodynamically stable.  Maternal Condition: stable   Baby condition / location:  nursery-stable    Signed: Surgeon(s): Fortino Sic, MD

## 2011-03-01 LAB — CBC
HCT: 32 % — ABNORMAL LOW (ref 36.0–46.0)
MCV: 89.1 fL (ref 78.0–100.0)
RDW: 14.3 % (ref 11.5–15.5)
WBC: 12.4 10*3/uL — ABNORMAL HIGH (ref 4.0–10.5)

## 2011-03-01 MED ORDER — MEASLES, MUMPS & RUBELLA VAC ~~LOC~~ INJ
0.5000 mL | INJECTION | Freq: Once | SUBCUTANEOUS | Status: DC
  Filled 2011-03-01: qty 0.5

## 2011-03-01 MED ORDER — SODIUM CHLORIDE 0.9 % IV SOLN
1.0000 ug/kg/h | INTRAVENOUS | Status: DC | PRN
  Filled 2011-03-01: qty 2.5

## 2011-03-01 MED ORDER — DIPHENHYDRAMINE HCL 50 MG/ML IJ SOLN: 12.5000 mg | INTRAMUSCULAR | Status: DC | PRN

## 2011-03-01 MED ORDER — BUPIVACAINE ON-Q PAIN PUMP (FOR ORDER SET NO CHG): INJECTION | Status: DC

## 2011-03-01 MED ORDER — ZOLPIDEM TARTRATE 5 MG PO TABS: 5.0000 mg | ORAL_TABLET | Freq: Every evening | ORAL | Status: DC | PRN

## 2011-03-01 MED ORDER — NALBUPHINE SYRINGE 5 MG/0.5 ML
5.0000 mg | INJECTION | INTRAMUSCULAR | Status: DC | PRN
  Filled 2011-03-01: qty 1

## 2011-03-01 MED ORDER — SIMETHICONE 80 MG PO CHEW: 80.0000 mg | CHEWABLE_TABLET | ORAL | Status: DC | PRN

## 2011-03-01 MED ORDER — DIBUCAINE 1 % RE OINT: 1.0000 "application " | TOPICAL_OINTMENT | RECTAL | Status: DC | PRN

## 2011-03-01 MED ORDER — ONDANSETRON HCL 4 MG/2ML IJ SOLN: 4.0000 mg | INTRAMUSCULAR | Status: DC | PRN

## 2011-03-01 MED ORDER — ONDANSETRON HCL 4 MG/2ML IJ SOLN: 4.0000 mg | Freq: Three times a day (TID) | INTRAMUSCULAR | Status: DC | PRN

## 2011-03-01 MED ORDER — KETOROLAC TROMETHAMINE 30 MG/ML IJ SOLN: 30.0000 mg | Freq: Four times a day (QID) | INTRAMUSCULAR | Status: AC | PRN

## 2011-03-01 MED ORDER — DIPHENHYDRAMINE HCL 25 MG PO CAPS: 25.0000 mg | ORAL_CAPSULE | Freq: Four times a day (QID) | ORAL | Status: DC | PRN

## 2011-03-01 MED ORDER — MEPERIDINE HCL 25 MG/ML IJ SOLN: 6.2500 mg | INTRAMUSCULAR | Status: DC | PRN

## 2011-03-01 MED ORDER — LACTATED RINGERS IV SOLN
INTRAVENOUS | Status: DC
  Administered 2011-03-01: 07:00:00 via INTRAVENOUS

## 2011-03-01 MED ORDER — OXYCODONE-ACETAMINOPHEN 5-325 MG PO TABS: 1.0000 | ORAL_TABLET | ORAL | Status: DC | PRN

## 2011-03-01 MED ORDER — ONDANSETRON HCL 4 MG PO TABS: 4.0000 mg | ORAL_TABLET | ORAL | Status: DC | PRN

## 2011-03-01 MED ORDER — IBUPROFEN 600 MG PO TABS: 600.0000 mg | ORAL_TABLET | Freq: Four times a day (QID) | ORAL | Status: DC | PRN

## 2011-03-01 MED ORDER — MENTHOL 3 MG MT LOZG: 1.0000 | LOZENGE | OROMUCOSAL | Status: DC | PRN

## 2011-03-01 MED ORDER — MEDROXYPROGESTERONE ACETATE 150 MG/ML IM SUSP: 150.0000 mg | INTRAMUSCULAR | Status: DC | PRN

## 2011-03-01 MED ORDER — WITCH HAZEL-GLYCERIN EX PADS: 1.0000 "application " | MEDICATED_PAD | CUTANEOUS | Status: DC | PRN

## 2011-03-01 MED ORDER — IBUPROFEN 600 MG PO TABS
600.0000 mg | ORAL_TABLET | Freq: Four times a day (QID) | ORAL | Status: DC
  Administered 2011-03-01 – 2011-03-03 (×7): 600 mg via ORAL
  Filled 2011-03-01 (×8): qty 1

## 2011-03-01 MED ORDER — LANOLIN HYDROUS EX OINT: 1.0000 "application " | TOPICAL_OINTMENT | CUTANEOUS | Status: DC | PRN

## 2011-03-01 MED ORDER — TETANUS-DIPHTH-ACELL PERTUSSIS 5-2.5-18.5 LF-MCG/0.5 IM SUSP
0.5000 mL | Freq: Once | INTRAMUSCULAR | Status: AC
  Administered 2011-03-02: 0.5 mL via INTRAMUSCULAR
  Filled 2011-03-01: qty 0.5

## 2011-03-01 MED ORDER — DIPHENHYDRAMINE HCL 50 MG/ML IJ SOLN: 25.0000 mg | INTRAMUSCULAR | Status: DC | PRN

## 2011-03-01 MED ORDER — PRENATAL PLUS 27-1 MG PO TABS
1.0000 | ORAL_TABLET | Freq: Every day | ORAL | Status: DC
  Administered 2011-03-02 – 2011-03-03 (×2): 1 via ORAL
  Filled 2011-03-01 (×2): qty 1

## 2011-03-01 MED ORDER — NALOXONE HCL 0.4 MG/ML IJ SOLN: 0.4000 mg | INTRAMUSCULAR | Status: DC | PRN

## 2011-03-01 MED ORDER — SENNOSIDES-DOCUSATE SODIUM 8.6-50 MG PO TABS
2.0000 | ORAL_TABLET | Freq: Every day | ORAL | Status: DC
  Administered 2011-03-01 – 2011-03-02 (×2): 2 via ORAL

## 2011-03-01 MED ORDER — SODIUM CHLORIDE 0.9 % IJ SOLN: 3.0000 mL | INTRAMUSCULAR | Status: DC | PRN

## 2011-03-01 MED ORDER — MEPERIDINE HCL 25 MG/ML IJ SOLN
INTRAMUSCULAR | Status: AC
  Filled 2011-03-01: qty 1

## 2011-03-01 MED ORDER — SIMETHICONE 80 MG PO CHEW
80.0000 mg | CHEWABLE_TABLET | Freq: Three times a day (TID) | ORAL | Status: DC
  Administered 2011-03-01 – 2011-03-03 (×8): 80 mg via ORAL

## 2011-03-01 MED ORDER — IBUPROFEN 600 MG PO TABS: 600.0000 mg | ORAL_TABLET | Freq: Four times a day (QID) | ORAL | Status: DC

## 2011-03-01 MED ORDER — METOCLOPRAMIDE HCL 5 MG/ML IJ SOLN: 10.0000 mg | Freq: Three times a day (TID) | INTRAMUSCULAR | Status: DC | PRN

## 2011-03-01 MED ORDER — DIPHENHYDRAMINE HCL 25 MG PO CAPS: 25.0000 mg | ORAL_CAPSULE | ORAL | Status: DC | PRN

## 2011-03-01 NOTE — Progress Notes (Signed)

## 2011-03-01 NOTE — Progress Notes (Signed)
Bolus IV fluids given LR per MD. Noted noticed improvement of fluid output of clear yellow urine. Ambulating well. IV changed to SL. F/C d/c'd/ VS WDL

## 2011-03-01 NOTE — Progress Notes (Signed)
POD #1/PPD #1:  Pt without complaints.  Doing well postop.  Pain controlled.  No flatus yet.  Tolerating a regular diet.  Today, borderline uop x several hours in the a.m. And early p.m.  500 cc bolus ordered and then uop picked up to, per nurse, approx 200 cc/hr for last several hours.  Foley, therefore, just removed.  AFVSS; elevation in maternal HR last night now normalizing. Incision d/d Abd:  Soft, obese, appropriately tender.   Lower Ext:  Neg homans.  + bilateral symmetric 1-2+ edema.  Crit:  32.  A/P:  Routine care.  Recommended oob and ambulate in halls later tonight or tomorrow.  Sleep on side to allow appropriate blood flow given edema.  Routine care otherwise.  Patient is attempting to breastfeed and regularly using lactation services.  Desires d/c tomorrow.  Will see if patient successfully achieves routine postop milestones.

## 2011-03-01 NOTE — Anesthesia Postprocedure Evaluation (Signed)
  Anesthesia Post-op Note  Patient: Kristina Burgess  Procedure(s) Performed:  CESAREAN SECTION  Patient Location: Mother/Baby  Anesthesia Type: Epidural  Level of Consciousness: awake, alert  and oriented  Airway and Oxygen Therapy: Patient Spontanous Breathing and Patient connected to nasal cannula oxygen  Post-op Pain: mild  Post-op Assessment: Post-op Vital signs reviewed, Respiratory Function Stable, Patent Airway, NAUSEA AND VOMITING PRESENT and Pain level controlled  Post-op Vital Signs: stable  Complications: No apparent anesthesia complications

## 2011-03-02 NOTE — Progress Notes (Signed)
POD#2/PPD#2 s/p LTCS for arrest disorder.  Patient without complaints.  Has been tolerating regular diet.  No n/v.  Bleeding decreasing.  Has not yet ambulated in halls.  Voiding without difficulty.  + flatus.  AFVSS Fundus firm, at umbilicus.  Abd appropriately tender Incision:  C/d/i with steris/stitch.  No evidence of infection. Lower Ext:  + 2+ pitting edema.  Neg homans.  No c/c.  A/P:  Routine care.  Having some difficulty with breastfeeding, with neonate losing 6 oz.  Pain very well controlled.  Asked pt to ambulate in halls today multiple times.  Consider d/c in p.m. If infant discharged.  Otherwise, will anticipate d/c in a.m. Of POD #3.

## 2011-03-03 ENCOUNTER — Encounter (HOSPITAL_COMMUNITY): Payer: Self-pay | Admitting: Obstetrics and Gynecology

## 2011-03-03 ENCOUNTER — Encounter (HOSPITAL_COMMUNITY)
Admission: RE | Admit: 2011-03-03 | Discharge: 2011-03-03 | Disposition: A | Discharge status: Home / Self Care | Payer: BC Managed Care – PPO | Source: Ambulatory Visit | Attending: Obstetrics and Gynecology | Admitting: Obstetrics and Gynecology

## 2011-03-03 DIAGNOSIS — O923 Agalactia: Secondary | ICD-10-CM | POA: Insufficient documentation

## 2011-03-03 NOTE — Progress Notes (Signed)
Subjective: Postpartum Day 3: Cesarean Delivery Patient reports tolerating PO, + BM and no problems voiding.    Objective: Vital signs in last 24 hours: Temp:  [97.7 F (36.5 C)-98.2 F (36.8 C)] 97.7 F (36.5 C) (10/18 0520) Pulse Rate:  [76-96] 90  (10/18 0520) Resp:  [18-20] 18  (10/18 0520) BP: (135-153)/(83-87) 137/87 mmHg (10/18 0520)  Physical Exam:  General: alert, cooperative and no distress Lochia: appropriate Uterine Fundus: firm Incision: healing well DVT Evaluation: No evidence of DVT seen on physical exam.   Basename 03/01/11 0530  HGB 10.7*  HCT 32.0*    Assessment/Plan: Status post Cesarean section. Doing well postoperatively.  Discharge home with standard precautions and return to clinic in 2  weeks.  Auston Halfmann E 03/03/2011, 10:41 AM

## 2011-03-03 NOTE — Progress Notes (Signed)
Obstetric Discharge Summary Reason for Admission: induction of labor Prenatal Procedures: none Intrapartum Procedures: cesarean: low cervical, transverse Postpartum Procedures: none Complications-Operative and Postpartum: none  Hemoglobin  Date Value Range Status  03/01/2011 10.7* 12.0-15.0 (g/dL) Final     HCT  Date Value Range Status  03/01/2011 32.0* 36.0-46.0 (%) Final    Discharge Diagnoses: Post-date pregnancy ,delivered Discharge Information: Date: 03/03/2011 Activity: pelvic rest Diet: routine Medications: Ibuprofen Condition: stable Instructions: refer to practice specific booklet Discharge to: home   Newborn Data: Live born  Information for the patient's newborn:  Trichelle, Lehan Girl Haruna [[id number]]  female ; APGAR , ; weight ;  Home with mother.  Lynix Bonine E 03/03/2011, 10:48 AM

## 2011-03-09 ENCOUNTER — Encounter (HOSPITAL_COMMUNITY): Payer: BC Managed Care – PPO

## 2011-03-23 ENCOUNTER — Inpatient Hospital Stay (HOSPITAL_COMMUNITY)
Admission: AD | Admit: 2011-03-23 | Payer: BC Managed Care – PPO | Source: Ambulatory Visit | Admitting: Obstetrics and Gynecology

## 2011-04-04 ENCOUNTER — Other Ambulatory Visit: Payer: Self-pay | Admitting: Obstetrics and Gynecology

## 2011-04-04 ENCOUNTER — Other Ambulatory Visit (HOSPITAL_COMMUNITY)
Admission: RE | Admit: 2011-04-04 | Discharge: 2011-04-04 | Disposition: A | Discharge status: Home / Self Care | Payer: BC Managed Care – PPO | Source: Ambulatory Visit | Attending: Obstetrics and Gynecology | Admitting: Obstetrics and Gynecology

## 2011-04-04 DIAGNOSIS — Z124 Encounter for screening for malignant neoplasm of cervix: Secondary | ICD-10-CM | POA: Insufficient documentation

## 2011-04-04 DIAGNOSIS — Z1159 Encounter for screening for other viral diseases: Secondary | ICD-10-CM | POA: Insufficient documentation

## 2012-01-19 ENCOUNTER — Ambulatory Visit (INDEPENDENT_AMBULATORY_CARE_PROVIDER_SITE_OTHER): Payer: BC Managed Care – PPO | Admitting: Family Medicine

## 2012-01-19 ENCOUNTER — Ambulatory Visit: Payer: BC Managed Care – PPO

## 2012-01-19 VITALS — BP 146/83 | HR 90 | Temp 97.9°F | Resp 18

## 2012-01-19 DIAGNOSIS — R071 Chest pain on breathing: Secondary | ICD-10-CM

## 2012-01-19 DIAGNOSIS — M549 Dorsalgia, unspecified: Secondary | ICD-10-CM

## 2012-01-19 DIAGNOSIS — M25519 Pain in unspecified shoulder: Secondary | ICD-10-CM

## 2012-01-19 MED ORDER — MELOXICAM 7.5 MG PO TABS
7.5000 mg | ORAL_TABLET | Freq: Every day | ORAL | Status: AC
Start: 2012-01-19 — End: 2013-01-18

## 2012-01-19 MED ORDER — METHOCARBAMOL 500 MG PO TABS: 500.0000 mg | ORAL_TABLET | Freq: Every evening | ORAL | Status: AC | PRN
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
## 2017-05-05 ENCOUNTER — Encounter (HOSPITAL_COMMUNITY): Payer: Self-pay | Admitting: *Deleted

## 2017-05-05 ENCOUNTER — Inpatient Hospital Stay (HOSPITAL_COMMUNITY): Payer: BLUE CROSS/BLUE SHIELD

## 2017-05-05 ENCOUNTER — Other Ambulatory Visit: Payer: Self-pay

## 2017-05-05 ENCOUNTER — Inpatient Hospital Stay (HOSPITAL_COMMUNITY)
Admission: AD | Admit: 2017-05-05 | Discharge: 2017-05-06 | DRG: 776 | Disposition: A | Discharge status: Home / Self Care | Payer: BLUE CROSS/BLUE SHIELD | Source: Ambulatory Visit | Attending: Obstetrics and Gynecology | Admitting: Obstetrics and Gynecology

## 2017-05-05 DIAGNOSIS — O99215 Obesity complicating the puerperium: Secondary | ICD-10-CM | POA: Diagnosis present

## 2017-05-05 DIAGNOSIS — O9081 Anemia of the puerperium: Secondary | ICD-10-CM | POA: Diagnosis present

## 2017-05-05 DIAGNOSIS — O1003 Pre-existing essential hypertension complicating the puerperium: Secondary | ICD-10-CM | POA: Diagnosis present

## 2017-05-05 DIAGNOSIS — Z9889 Other specified postprocedural states: Secondary | ICD-10-CM

## 2017-05-05 HISTORY — DX: Essential (primary) hypertension: I10

## 2017-05-05 LAB — COMPREHENSIVE METABOLIC PANEL
ALBUMIN: 2 g/dL — AB (ref 3.5–5.0)
ALT: 24 U/L (ref 14–54)
ANION GAP: 6 (ref 5–15)
AST: 35 U/L (ref 15–41)
Alkaline Phosphatase: 95 U/L (ref 38–126)
BILIRUBIN TOTAL: 0.2 mg/dL — AB (ref 0.3–1.2)
BUN: 14 mg/dL (ref 6–20)
CHLORIDE: 111 mmol/L (ref 101–111)
CO2: 18 mmol/L — ABNORMAL LOW (ref 22–32)
Calcium: 7.2 mg/dL — ABNORMAL LOW (ref 8.9–10.3)
Creatinine, Ser: 0.97 mg/dL (ref 0.44–1.00)
GFR calc Af Amer: >60 mL/min (ref >60)
GFR calc non Af Amer: >60 mL/min (ref >60)
GLUCOSE: 156 mg/dL — AB (ref 65–99)
POTASSIUM: 4.4 mmol/L (ref 3.5–5.1)
SODIUM: 135 mmol/L (ref 135–145)
TOTAL PROTEIN: 4.6 g/dL — AB (ref 6.5–8.1)

## 2017-05-05 LAB — LACTIC ACID, PLASMA: Lactic Acid, Venous: 1.7 mmol/L (ref 0.5–1.9)

## 2017-05-05 LAB — CBC
HCT: 26.8 % — ABNORMAL LOW (ref 36.0–46.0)
HEMATOCRIT: 21.2 % — AB (ref 36.0–46.0)
Hemoglobin: 6.9 g/dL — CL (ref 12.0–15.0)
Hemoglobin: 9.1 g/dL — ABNORMAL LOW (ref 12.0–15.0)
MCH: 28.9 pg (ref 26.0–34.0)
MCH: 29.2 pg (ref 26.0–34.0)
MCHC: 32.5 g/dL (ref 30.0–36.0)
MCHC: 34 g/dL (ref 30.0–36.0)
MCV: 88.7 fL (ref 78.0–100.0)
MCV: 85.9 fL (ref 78.0–100.0)
PLATELETS: 294 10*3/uL (ref 150–400)
PLATELETS: 220 10*3/uL (ref 150–400)
RBC: 2.39 MIL/uL — ABNORMAL LOW (ref 3.87–5.11)
RBC: 3.12 MIL/uL — ABNORMAL LOW (ref 3.87–5.11)
RDW: 16.1 % — AB (ref 11.5–15.5)
RDW: 16.5 % — AB (ref 11.5–15.5)
WBC: 15.1 10*3/uL — AB (ref 4.0–10.5)
WBC: 11.3 10*3/uL — ABNORMAL HIGH (ref 4.0–10.5)

## 2017-05-05 LAB — GLUCOSE, CAPILLARY: Glucose-Capillary: 134 mg/dL — ABNORMAL HIGH (ref 65–99)

## 2017-05-05 LAB — PREPARE RBC (CROSSMATCH)

## 2017-05-05 LAB — LIPASE, BLOOD: LIPASE: 12 U/L (ref 11–51)

## 2017-05-05 MED ORDER — PRENATAL VITAMINS 28-0.8 MG PO TABS
ORAL_TABLET | ORAL | Status: DC
Start: 2017-05-05 — End: 2017-05-05

## 2017-05-05 MED ORDER — MORPHINE SULFATE (PF) 10 MG/ML IV SOLN
10.00 | INTRAVENOUS | Status: DC
Start: ? — End: 2017-05-05

## 2017-05-05 MED ORDER — ACETAMINOPHEN 325 MG PO TABS
650.00 | ORAL_TABLET | ORAL | Status: DC
Start: ? — End: 2017-05-05

## 2017-05-05 MED ORDER — PANTOPRAZOLE SODIUM 40 MG IV SOLR
40.0000 mg | Freq: Every day | INTRAVENOUS | Status: DC
  Administered 2017-05-05: 40 mg via INTRAVENOUS
  Filled 2017-05-05 (×2): qty 40

## 2017-05-05 MED ORDER — GENERIC EXTERNAL MEDICATION
Status: DC
Start: ? — End: 2017-05-05

## 2017-05-05 MED ORDER — MORPHINE SULFATE (PF) 4 MG/ML IV SOLN
4.00 | INTRAVENOUS | Status: DC
Start: ? — End: 2017-05-05

## 2017-05-05 MED ORDER — IOPAMIDOL (ISOVUE-300) INJECTION 61%
30.0000 mL | INTRAVENOUS | Status: AC
  Administered 2017-05-05: 30 mL via ORAL

## 2017-05-05 MED ORDER — IBUPROFEN 800 MG PO TABS
800.00 | ORAL_TABLET | ORAL | Status: DC
Start: 2017-05-04 — End: 2017-05-05

## 2017-05-05 MED ORDER — VARICELLA VIRUS VACCINE LIVE 1350 PFU/0.5ML ~~LOC~~ INJ
0.50 | INJECTION | SUBCUTANEOUS | Status: DC
Start: ? — End: 2017-05-05

## 2017-05-05 MED ORDER — MEASLES, MUMPS & RUBELLA VAC ~~LOC~~ INJ
0.50 | INJECTION | SUBCUTANEOUS | Status: DC
Start: ? — End: 2017-05-05

## 2017-05-05 MED ORDER — HYDROMORPHONE HCL 1 MG/ML IJ SOLN
1.0000 mg | INTRAMUSCULAR | Status: DC | PRN
  Administered 2017-05-05 (×3): 1 mg via INTRAVENOUS
  Filled 2017-05-05 (×3): qty 1

## 2017-05-05 MED ORDER — ANTACID & ANTIGAS 200-200-20 MG/5ML PO SUSP
30.00 | ORAL | Status: DC
Start: ? — End: 2017-05-05

## 2017-05-05 MED ORDER — BISACODYL 10 MG RE SUPP
10.00 | RECTAL | Status: DC
Start: ? — End: 2017-05-05

## 2017-05-05 MED ORDER — LACTATED RINGERS IV BOLUS (SEPSIS): 1000.0000 mL | Freq: Once | INTRAVENOUS | Status: DC

## 2017-05-05 MED ORDER — HYDROCODONE-ACETAMINOPHEN 10-325 MG PO TABS
ORAL_TABLET | ORAL | Status: DC
Start: ? — End: 2017-05-05

## 2017-05-05 MED ORDER — OXYCODONE HCL 10 MG PO TABS
10.00 | ORAL_TABLET | ORAL | Status: DC
Start: ? — End: 2017-05-05

## 2017-05-05 MED ORDER — HYDROCODONE-ACETAMINOPHEN 5-325 MG PO TABS
ORAL_TABLET | ORAL | Status: DC
Start: ? — End: 2017-05-05

## 2017-05-05 MED ORDER — FUROSEMIDE 10 MG/ML IJ SOLN
10.0000 mg | Freq: Once | INTRAMUSCULAR | Status: AC
  Administered 2017-05-05: 10 mg via INTRAVENOUS
  Filled 2017-05-05: qty 2

## 2017-05-05 MED ORDER — DOCUSATE SODIUM 100 MG PO CAPS
100.00 | ORAL_CAPSULE | ORAL | Status: DC
Start: 2017-05-04 — End: 2017-05-05

## 2017-05-05 MED ORDER — LANOLIN EX OINT
TOPICAL_OINTMENT | CUTANEOUS | Status: DC
Start: ? — End: 2017-05-05

## 2017-05-05 MED ORDER — SALINE NASAL SPRAY 0.65 % NA SOLN
NASAL | Status: DC
Start: ? — End: 2017-05-05

## 2017-05-05 MED ORDER — SODIUM CHLORIDE 0.9 % IV SOLN
Freq: Once | INTRAVENOUS | Status: AC
  Administered 2017-05-05: 08:00:00 via INTRAVENOUS

## 2017-05-05 MED ORDER — BENZOCAINE-MENTHOL 15-3.6 MG MT LOZG
LOZENGE | OROMUCOSAL | Status: DC
Start: ? — End: 2017-05-05

## 2017-05-05 MED ORDER — ACETAMINOPHEN 325 MG PO TABS
650.0000 mg | ORAL_TABLET | Freq: Once | ORAL | Status: AC
  Administered 2017-05-05: 650 mg via ORAL
  Filled 2017-05-05: qty 2

## 2017-05-05 MED ORDER — ONDANSETRON HCL 4 MG PO TABS
4.0000 mg | ORAL_TABLET | Freq: Four times a day (QID) | ORAL | Status: DC | PRN
Start: 2017-05-05 — End: 2017-05-06

## 2017-05-05 MED ORDER — ENOXAPARIN SODIUM 60 MG/0.6ML ~~LOC~~ SOLN
60.00 | SUBCUTANEOUS | Status: DC
Start: 2017-05-05 — End: 2017-05-05

## 2017-05-05 MED ORDER — LACTATED RINGERS IV SOLN
INTRAVENOUS | Status: DC
  Administered 2017-05-05 – 2017-05-06 (×2): via INTRAVENOUS

## 2017-05-05 MED ORDER — ROPIVACAINE HCL-NACL 0.2-0.9 % IJ SOLN
550.00 | INTRAMUSCULAR | Status: DC
Start: ? — End: 2017-05-05

## 2017-05-05 MED ORDER — ONDANSETRON HCL 4 MG/2ML IJ SOLN: 4.0000 mg | Freq: Four times a day (QID) | INTRAMUSCULAR | Status: DC | PRN

## 2017-05-05 MED ORDER — SIMETHICONE 80 MG PO CHEW
80.00 | CHEWABLE_TABLET | ORAL | Status: DC
Start: ? — End: 2017-05-05

## 2017-05-05 MED ORDER — LABETALOL HCL 100 MG PO TABS
100.00 | ORAL_TABLET | ORAL | Status: DC
Start: 2017-05-04 — End: 2017-05-05

## 2017-05-05 MED ORDER — SODIUM CHLORIDE 0.9 % IV SOLN
INTRAVENOUS | Status: DC
  Administered 2017-05-05 (×2): via INTRAVENOUS

## 2017-05-05 MED ORDER — IOPAMIDOL (ISOVUE-300) INJECTION 61%
100.0000 mL | Freq: Once | INTRAVENOUS | Status: AC | PRN
  Administered 2017-05-05: 100 mL via INTRAVENOUS

## 2017-05-05 MED ORDER — HYDROMORPHONE HCL 1 MG/ML IJ SOLN
1.00 | INTRAMUSCULAR | Status: DC
Start: ? — End: 2017-05-05

## 2017-05-05 MED ORDER — GUAIFENESIN 100 MG/5ML PO LIQD
200.00 | ORAL | Status: DC
Start: ? — End: 2017-05-05

## 2017-05-05 MED ORDER — LACTATED RINGERS IV SOLN
INTRAVENOUS | Status: DC
Start: ? — End: 2017-05-05

## 2017-05-05 MED ORDER — DIPHENHYDRAMINE HCL 25 MG PO CAPS
25.0000 mg | ORAL_CAPSULE | Freq: Once | ORAL | Status: AC
  Administered 2017-05-05: 25 mg via ORAL
  Filled 2017-05-05: qty 1

## 2017-05-05 MED ORDER — BENZOCAINE-MENTHOL 20-0.5 % EX AERO
INHALATION_SPRAY | CUTANEOUS | Status: DC
Start: ? — End: 2017-05-05

## 2017-05-05 MED ORDER — SODIUM CHLORIDE 0.9 % IV SOLN
INTRAVENOUS | Status: DC
  Administered 2017-05-05: 07:00:00 via INTRAVENOUS

## 2017-05-05 MED ORDER — MAGNESIUM HYDROXIDE 400 MG/5ML PO SUSP
30.00 | ORAL | Status: DC
Start: ? — End: 2017-05-05

## 2017-05-05 MED ORDER — KETOROLAC TROMETHAMINE 30 MG/ML IJ SOLN
30.0000 mg | Freq: Once | INTRAMUSCULAR | Status: AC
  Administered 2017-05-05: 30 mg via INTRAVENOUS
  Filled 2017-05-05: qty 1

## 2017-05-05 NOTE — MAU Provider Note (Signed)
History     CSN: [id number]  Arrival date and time: 05/05/17 0440   None     Chief Complaint  Patient presents with  . Loss of Consciousness   HPI Patient is a [id number] who is 3 days s/p elective repeat cesarean section. [facility] and she was discharged with OnQ bupivicaine pump and labetalol 100mg  BID. She was brought by EMS due to syncope with hypotension. Has RUQ abdominal pain with sharp pains radiating into shoulder blades and neck. Pain worse with movement and improved with rest. No nausea or vomiting associated with abdominal pain. No known diagnosis of gallstones. She thought that this might be related to pre-eclampsia and she took an additional dose of labetalol 100mg . No palliating or provoking factors. She does admit to some perioral numbness, but no metalic taste in mouth.   Postoperative Hg 10.0 on 12/19.  OB History    Gravida Para Term Preterm AB Living   2 2 2     2    SAB TAB Ectopic Multiple Live Births           2      Past Medical History:  Diagnosis Date  . Colon polyp    removed 4-5 years ago  . Infertility associated with anovulation    was on clomid    Past Surgical History:  Procedure Laterality Date  . CESAREAN SECTION  02/28/2011   Procedure: CESAREAN SECTION;  Surgeon: Fortino SicEleanor E Greene, MD;  Location: [facility];  Service: Gynecology;  Laterality: N/A;  . GI polyp removal     4-5 years ago    Family History  Problem Relation Age of Onset  . Heart disease Paternal Grandmother        pacemaker  . Anesthesia problems Neg Hx   . Hypotension Neg Hx   . Malignant hyperthermia Neg Hx   . Pseudochol deficiency Neg Hx     Social History   Tobacco Use  . Smoking status: Never Smoker  . Smokeless tobacco: Never Used  Substance Use Topics  . Alcohol use: No  . Drug use: No    Allergies: No Known Allergies  Medications Prior to Admission  Medication Sig Dispense Refill Last Dose  . labetalol (NORMODYNE) 100 MG tablet  Take 100 mg by mouth 2 (two) times daily.   05/05/2017 at Unknown time  . Norgestim-Eth Estrad Triphasic (ORTHO TRI-CYCLEN, 28, PO) Take by mouth.   Taking    Review of Systems Physical Exam   Blood pressure (!) 55/26, pulse (!) 115, temperature 97.8 F (36.6 C), temperature source Oral, resp. rate 17, height 5\' 4"  (1.626 m), weight (!) 313 lb (142 kg).  Physical Exam  Constitutional: She is oriented to person, place, and time. She appears well-developed and well-nourished.  HENT:  Head: Normocephalic and atraumatic.  Right Ear: External ear normal.  Left Ear: External ear normal.  Eyes: Conjunctivae are normal. Pupils are equal, round, and reactive to light.  Neck: Normal range of motion. Neck supple.  Cardiovascular: Normal rate, regular rhythm and normal heart sounds.  Respiratory: Effort normal and breath sounds normal. No respiratory distress. She has no wheezes. She has no rales.  GI: She exhibits no distension and no mass. There is tenderness (RUQ tenderness). There is no rebound and no guarding.    Incision clean, dry, intact. OnQ catheter inserted ~3 cm above incision on right corner  Neurological: She is alert and oriented to person, place, and time.  Skin: Skin is warm  and dry. No rash noted. No erythema. No pallor.  Psychiatric: She has a normal mood and affect. Her behavior is normal. Judgment and thought content normal.    Results for orders placed or performed during the hospital encounter of 05/05/17 (from the past 24 hour(s))  Glucose, capillary     Status: Abnormal   Collection Time: 05/05/17  5:28 AM  Result Value Ref Range   Glucose-Capillary 134 (H) 65 - 99 mg/dL  Lactic acid, plasma     Status: None   Collection Time: 05/05/17  5:41 AM  Result Value Ref Range   Lactic Acid, Venous 1.7 0.5 - 1.9 mmol/L  Type and screen [facility]     Status: None (Preliminary result)   Collection Time: 05/05/17  5:41 AM  Result Value Ref Range    ABO/RH(D) O POS    Antibody Screen PENDING    Sample Expiration 05/08/2017   Lipase, blood     Status: None   Collection Time: 05/05/17  5:41 AM  Result Value Ref Range   Lipase 12 11 - 51 U/L  CBC     Status: Abnormal   Collection Time: 05/05/17  5:44 AM  Result Value Ref Range   WBC 15.1 (H) 4.0 - 10.5 K/uL   RBC 2.39 (L) 3.87 - 5.11 MIL/uL   Hemoglobin 6.9 (LL) 12.0 - 15.0 g/dL   HCT 16.121.2 (L) 09.636.0 - 04.546.0 %   MCV 88.7 78.0 - 100.0 fL   MCH 28.9 26.0 - 34.0 pg   MCHC 32.5 30.0 - 36.0 g/dL   RDW 40.916.1 (H) 81.111.5 - 91.415.5 %   Platelets 294 150 - 400 K/uL  Comprehensive metabolic panel     Status: Abnormal   Collection Time: 05/05/17  5:44 AM  Result Value Ref Range   Sodium 135 135 - 145 mmol/L   Potassium 4.4 3.5 - 5.1 mmol/L   Chloride 111 101 - 111 mmol/L   CO2 18 (L) 22 - 32 mmol/L   Glucose, Bld 156 (H) 65 - 99 mg/dL   BUN 14 6 - 20 mg/dL   Creatinine, Ser 7.820.97 0.44 - 1.00 mg/dL   Calcium 7.2 (L) 8.9 - 10.3 mg/dL   Total Protein 4.6 (L) 6.5 - 8.1 g/dL   Albumin 2.0 (L) 3.5 - 5.0 g/dL   AST 35 15 - 41 U/L   ALT 24 14 - 54 U/L   Alkaline Phosphatase 95 38 - 126 U/L   Total Bilirubin 0.2 (L) 0.3 - 1.2 mg/dL   GFR calc non Af Amer >60 >60 mL/min   GFR calc Af Amer >60 >60 mL/min   Anion gap 6 5 - 15   Dg Chest Portable 1 View  Result Date: 05/05/2017 CLINICAL DATA:  Syncope and hypotension. Back pain. Caesarean section 3 days prior. EXAM: PORTABLE CHEST 1 VIEW COMPARISON:  None. FINDINGS: The cardiomediastinal contours are normal. The lungs are clear. Pulmonary vasculature is normal. No consolidation, pleural effusion, or pneumothorax. No acute osseous abnormalities are seen. IMPRESSION: Unremarkable portable AP view of the chest. Electronically Signed   By: Rubye OaksMelanie  Ehinger M.D.   On: 05/05/2017 06:19    MAU Course  Procedures EKG personally reviewed and interpreted: NSR with no bradycardia or PR prolongation. No ST elevation or depression CXR reviewed and interpreted by  me: No infiltrate or acute process. No cadiomegaly   MDM Second IV started and second liter IVF bolus started. Bupivicaine pump stopped (turned off and line clamp placed) No  arrhythmia suggestive of beta blocker or local anesthetic toxicity Glucose slightly elevated, also pointing against beta blocker toxicity Hg 6.9 - pt consented to blood transfusion BP responding to IVF boluses - received 3L total. Blood transfusion x3 units started.   Assessment and Plan  Patient signed out to Nettie Elm, MD at 8:40am.  Levie Heritage 05/05/2017, 5:33 AM

## 2017-05-05 NOTE — MAU Note (Signed)
@   0630 CT called and notified pt needs a STAT CT of abdomen and pelvis. Truddie HiddenLou Radiologist stated she will bring pt the contrast to room.

## 2017-05-05 NOTE — MAU Note (Signed)
Dr. Alysia PennaErvin in to discuss results with pt & her family.

## 2017-05-05 NOTE — MAU Note (Signed)
Pt transported to CT by stretcher, blood transfusion continues.  BP 94/52.

## 2017-05-05 NOTE — Progress Notes (Signed)
I received a referral from pt's nurse due to pt being re-admitted.  Kristina Burgess is coping as well as can be expected.  She was emotional after the drama of the morning and does not like being separated from her baby, [place]ow, but she has good family support and is trying hard to stay positive.  Her parents are in the room with her and her sister is watching her children.  She is aware of ongoing availability if it is needed.  [id number]Chaplain Katy Anasophia Pecor, BCc Pager, [phone] 2:13 PM

## 2017-05-05 NOTE — MAU Note (Signed)
Pt transported back to room after CT by stretcher, BP 113/52.

## 2017-05-05 NOTE — MAU Note (Signed)
Pt presents to MAU via EMS c/o syncope. Medic stated pt completely passed out, EMS stated she had loss of conscience but still had a pulse. Pt had BP of 80/50. Pt states right sided abdominal pain on the right upper quad and states she has sharp pains in her shoulder blades and neck. Pt was pre-eclamptic with pregnancy and was prescribed 100mg  labetalol and took one dose today and started to feel dizzy and took her other does before passing out. Pt had passed a ping pong sized clot before she fainted.

## 2017-05-05 NOTE — H&P (Signed)
Kristina Burgess is an 41 y.o. female G2P2002 POD # 3 from a RLTCS d/t prior c section and CHTN. Surgery performed at [facility].  Pt presented to MAU via EMS following a syncopal episode at home. Pt reports her post op course was unremarkable. Was ambulating and voiding without problems. D/C home on POD # 2.  Pt reports she up to restroom @ 2 AM. No problems. Sit down in a chair was felt light head and had some abd pain. EMS called and shortly after their arrival, she passed out.  Upon presentation to MAU, noted to be hypotensive and Hgb 6.9. Was given IV fluids, blood transfusion ordered. CT scan demonstrated fluid and clot in pelvis.   Currently pt feels better except for abd pain. Denies lightheadedness.      Past Medical History:  Diagnosis Date  . Colon polyp    removed 4-5 years ago  . Hypertension   . Infertility associated with anovulation    was on clomid    Past Surgical History:  Procedure Laterality Date  . CESAREAN SECTION  02/28/2011   Procedure: CESAREAN SECTION;  Surgeon: Fortino Sic, MD;  Location: [facility];  Service: Gynecology;  Laterality: N/A;  . GI polyp removal     4-5 years ago    Family History  Problem Relation Age of Onset  . Heart disease Paternal Grandmother        pacemaker  . Anesthesia problems Neg Hx   . Hypotension Neg Hx   . Malignant hyperthermia Neg Hx   . Pseudochol deficiency Neg Hx     Social History:  reports that  has never smoked. she has never used smokeless tobacco. She reports that she does not drink alcohol or use drugs.  Allergies: No Known Allergies  Medications Prior to Admission  Medication Sig Dispense Refill Last Dose  . labetalol (NORMODYNE) 100 MG tablet Take 100 mg by mouth 2 (two) times daily.   05/05/2017 at Unknown time  . Norgestim-Eth Estrad Triphasic (ORTHO TRI-CYCLEN, 28, PO) Take by mouth.   Taking    Review of Systems  Respiratory: Negative.   Cardiovascular: Positive for leg swelling.   Gastrointestinal: Positive for abdominal pain.  Genitourinary: Negative.   Neurological: Positive for weakness.    Blood pressure (!) 115/50, pulse 89, temperature 97.6 F (36.4 C), temperature source Oral, resp. rate 20, height 5\' 4"  (1.626 m), weight (!) 142 kg (313 lb), SpO2 100 %. Physical Exam  Constitutional:  Alert, converses without problems  Cardiovascular: Normal rate and regular rhythm.  Respiratory: Effort normal and breath sounds normal.  GI:  Soft faint BS diffuse tenderness no rebound or guarding ON Q pump in place ( previously turned off by Dr Adelene Amas) Incision CDI Uterus U-2 but limited by pt habitus  Genitourinary:  Genitourinary Comments: Scant blood noted on peripad Scant Lochia noted  Musculoskeletal:  1-2 + edema    Results for orders placed or performed during the hospital encounter of 05/05/17 (from the past 24 hour(s))  Glucose, capillary     Status: Abnormal   Collection Time: 05/05/17  5:28 AM  Result Value Ref Range   Glucose-Capillary 134 (H) 65 - 99 mg/dL  Lactic acid, plasma     Status: None   Collection Time: 05/05/17  5:41 AM  Result Value Ref Range   Lactic Acid, Venous 1.7 0.5 - 1.9 mmol/L  Type and screen [facility]     Status: None (Preliminary result)   Collection Time:  05/05/17  5:41 AM  Result Value Ref Range   ABO/RH(D) O POS    Antibody Screen NEG    Sample Expiration 05/08/2017    Unit Number [id number]    Blood Component Type RED CELLS,LR    Unit division 00    Status of Unit ISSUED    Transfusion Status OK TO TRANSFUSE    Crossmatch Result Compatible    Unit Number [id number]    Blood Component Type RBC LR PHER2    Unit division 00    Status of [facility]    Transfusion Status OK TO TRANSFUSE    Crossmatch Result Compatible    Unit Number [id number]    Blood Component Type RED CELLS,LR    Unit division 00    [facility]    Transfusion Status OK TO TRANSFUSE     Crossmatch Result Compatible    Unit Number [id number]    Blood Component Type RED CELLS,LR    Unit division 00    Status of Unit ALLOCATED    Transfusion Status OK TO TRANSFUSE    Crossmatch Result Compatible    Unit Number [id number]    Blood Component Type RBC, LR IRR    Unit division 00    Status of Unit ISSUED    Transfusion Status OK TO TRANSFUSE    Crossmatch Result Compatible   Lipase, blood     Status: None   Collection Time: 05/05/17  5:41 AM  Result Value Ref Range   Lipase 12 11 - 51 U/L  CBC     Status: Abnormal   Collection Time: 05/05/17  5:44 AM  Result Value Ref Range   WBC 15.1 (H) 4.0 - 10.5 K/uL   RBC 2.39 (L) 3.87 - 5.11 MIL/uL   Hemoglobin 6.9 (LL) 12.0 - 15.0 g/dL   HCT 29.521.2 (L) 18.836.0 - 41.646.0 %   MCV 88.7 78.0 - 100.0 fL   MCH 28.9 26.0 - 34.0 pg   MCHC 32.5 30.0 - 36.0 g/dL   RDW 60.616.1 (H) 30.111.5 - 60.115.5 %   Platelets 294 150 - 400 K/uL  Comprehensive metabolic panel     Status: Abnormal   Collection Time: 05/05/17  5:44 AM  Result Value Ref Range   Sodium 135 135 - 145 mmol/L   Potassium 4.4 3.5 - 5.1 mmol/L   Chloride 111 101 - 111 mmol/L   CO2 18 (L) 22 - 32 mmol/L   Glucose, Bld 156 (H) 65 - 99 mg/dL   BUN 14 6 - 20 mg/dL   Creatinine, Ser 0.930.97 0.44 - 1.00 mg/dL   Calcium 7.2 (L) 8.9 - 10.3 mg/dL   Total Protein 4.6 (L) 6.5 - 8.1 g/dL   Albumin 2.0 (L) 3.5 - 5.0 g/dL   AST 35 15 - 41 U/L   ALT 24 14 - 54 U/L   Alkaline Phosphatase 95 38 - 126 U/L   Total Bilirubin 0.2 (L) 0.3 - 1.2 mg/dL   GFR calc non Af Amer >60 >60 mL/min   GFR calc Af Amer >60 >60 mL/min   Anion gap 6 5 - 15  Prepare RBC     Status: None   Collection Time: 05/05/17  6:10 AM  Result Value Ref Range   Order Confirmation ORDER PROCESSED BY BLOOD BANK     Ct Abdomen Pelvis W Contrast  Result Date: 05/05/2017 CLINICAL DATA:  Right abdominal pain, hypertension, syncope. Status post C-section on 05/02/2017. EXAM: CT ABDOMEN  AND PELVIS WITH CONTRAST TECHNIQUE:  Multidetector CT imaging of the abdomen and pelvis was performed using the standard protocol following bolus administration of intravenous contrast. CONTRAST:  100mL ISOVUE-300 IOPAMIDOL (ISOVUE-300) INJECTION 61% COMPARISON:  None. FINDINGS: Motion degraded images. Lower chest: Lung bases are clear. Hepatobiliary: Liver is within normal limits. Gallbladder is unremarkable. No intrahepatic or extrahepatic duct dilatation. Pancreas: Within normal limits. Spleen: Within normal limits. Adrenals/Urinary Tract: Adrenal glands are within normal limits. Kidneys are grossly unremarkable.  No hydronephrosis. Bladder is within normal limits. Stomach/Bowel: Stomach is within normal limits. No evidence of bowel obstruction. Appendix is not discretely visualized. Vascular/Lymphatic: No evidence of abdominal aortic aneurysm. No evidence of active extravasation. No suspicious abdominopelvic lymphadenopathy. Reproductive: Recent post gravid uterus. Fluid/ hemorrhage and tiny foci of gas in the lower uterine segment (series 2/image 80), likely postprocedural. Small to moderate extraperitoneal hemorrhage anterior to the uterus (series 2/ image 84). Other: Moderate abdominopelvic ascites, with hemorrhage in the right lower pelvis (series 2/image 70). Postsurgical changes along the anterior abdominal wall, including fluid/hemorrhage along the midline low anterior abdominal wall (series 2/image 79). Musculoskeletal: Mild degenerative changes of the visualized thoracolumbar spine. IMPRESSION: Recent post gravid uterus with postsurgical changes related to C-section. Small to moderate abdominopelvic ascites with superimposed hemorrhage in the right lower pelvis. Additional small to moderate extraperitoneal hemorrhage along the anterior pelvis. No evidence of active extravasation. Additional postsurgical changes along the lower anterior abdominal wall. Electronically Signed   By: Charline BillsSriyesh  Krishnan M.D.   On: 05/05/2017 09:33   Dg Chest  Portable 1 View  Result Date: 05/05/2017 CLINICAL DATA:  Syncope and hypotension. Back pain. Caesarean section 3 days prior. EXAM: PORTABLE CHEST 1 VIEW COMPARISON:  None. FINDINGS: The cardiomediastinal contours are normal. The lungs are clear. Pulmonary vasculature is normal. No consolidation, pleural effusion, or pneumothorax. No acute osseous abnormalities are seen. IMPRESSION: Unremarkable portable AP view of the chest. Electronically Signed   By: Rubye OaksMelanie  Ehinger M.D.   On: 05/05/2017 06:19    Assessment/Plan: POD # 3 RLTCS Anemia secondary to # 1  No evidence of active bleeding on CT scan S/P syncopal episode secondary to above CHTN Morbid obesity.   Will admit pt to floor for further blood transfusion and blood products as needed.  Close monitoring of VS. Will placed foley cathter to monitor UOP. Place SCD's.  Do not feel pt needs to be taken to the OR for surgerical evaluation presently. POC discussed with pt and family members.   Hermina StaggersMichael L Suzanne Garbers 05/05/2017, 11:19 AM

## 2017-05-05 NOTE — MAU Note (Signed)
CRITICAL VALUE ALERT  Critical Value:  Hgb 6.9   Date & Time Notied:  05/05/2017 @ 0606  Provider Notified: Dr. Adrian BlackwaterStinson   Orders Received/Actions taken: In department evaluating patient.

## 2017-05-05 NOTE — Progress Notes (Signed)
Patient ID: Stark KleinJanae Burgess, female   DOB: 21-Jun-1975, 41 y.o.   MRN: [id number] Feels better. Pain better controlled BP and pulse improved. Good UOP On 3rd unit of PRBC's ON-Q pump removed, black tip intact Will complete transfusion and check albs at 2000.

## 2017-05-06 DIAGNOSIS — O9081 Anemia of the puerperium: Principal | ICD-10-CM

## 2017-05-06 LAB — BPAM RBC
BLOOD PRODUCT EXPIRATION DATE: 201812272359
Blood Product Expiration Date: 201812252359
Blood Product Expiration Date: 201812262359
Blood Product Expiration Date: 201812272359
Blood Product Expiration Date: 201812272359
ISSUE DATE / TIME: 201812211524
ISSUE DATE / TIME: 201812211535
ISSUE DATE / TIME: 201812210701
ISSUE DATE / TIME: 201812211102
ISSUE DATE / TIME: 201812211622
UNIT TYPE AND RH: 5100
UNIT TYPE AND RH: 9500
UNIT TYPE AND RH: 9500
Unit Type and Rh: 5100
Unit Type and Rh: 9500

## 2017-05-06 LAB — TYPE AND SCREEN
ABO/RH(D): O POS
Antibody Screen: NEGATIVE
UNIT DIVISION: 0
UNIT DIVISION: 0
UNIT DIVISION: 0
UNIT DIVISION: 0
Unit division: 0

## 2017-05-06 LAB — CBC
HCT: 24.6 % — ABNORMAL LOW (ref 36.0–46.0)
HEMATOCRIT: 24.2 % — AB (ref 36.0–46.0)
HEMOGLOBIN: 8.5 g/dL — AB (ref 12.0–15.0)
Hemoglobin: 8.2 g/dL — ABNORMAL LOW (ref 12.0–15.0)
MCH: 29.6 pg (ref 26.0–34.0)
MCH: 29.1 pg (ref 26.0–34.0)
MCHC: 34.6 g/dL (ref 30.0–36.0)
MCHC: 33.9 g/dL (ref 30.0–36.0)
MCV: 85.7 fL (ref 78.0–100.0)
MCV: 85.8 fL (ref 78.0–100.0)
PLATELETS: 184 10*3/uL (ref 150–400)
PLATELETS: 197 10*3/uL (ref 150–400)
RBC: 2.87 MIL/uL — AB (ref 3.87–5.11)
RBC: 2.82 MIL/uL — ABNORMAL LOW (ref 3.87–5.11)
RDW: 16.6 % — ABNORMAL HIGH (ref 11.5–15.5)
RDW: 16.3 % — AB (ref 11.5–15.5)
WBC: 8.7 10*3/uL (ref 4.0–10.5)
WBC: 8.5 10*3/uL (ref 4.0–10.5)

## 2017-05-06 MED ORDER — LABETALOL HCL 100 MG PO TABS: 200.0000 mg | ORAL_TABLET | Freq: Two times a day (BID) | ORAL | 1 refills | Status: AC

## 2017-05-06 MED ORDER — SODIUM CHLORIDE 0.9 % IV SOLN: 250.0000 mL | INTRAVENOUS | Status: DC | PRN

## 2017-05-06 MED ORDER — SODIUM CHLORIDE 0.9% FLUSH
3.0000 mL | Freq: Two times a day (BID) | INTRAVENOUS | Status: DC
  Administered 2017-05-06: 3 mL via INTRAVENOUS

## 2017-05-06 MED ORDER — FUROSEMIDE 10 MG/ML IJ SOLN
40.0000 mg | Freq: Once | INTRAMUSCULAR | Status: AC
  Administered 2017-05-06: 40 mg via INTRAVENOUS
  Filled 2017-05-06: qty 4

## 2017-05-06 MED ORDER — SODIUM CHLORIDE 0.9 % IV SOLN: 510.0000 mg | Freq: Once | INTRAVENOUS | Status: DC

## 2017-05-06 MED ORDER — SODIUM CHLORIDE 0.9% FLUSH: 3.0000 mL | INTRAVENOUS | Status: DC | PRN

## 2017-05-06 MED ORDER — FERROUS SULFATE 325 (65 FE) MG PO TABS: 325.0000 mg | ORAL_TABLET | Freq: Two times a day (BID) | ORAL | 1 refills | Status: AC

## 2017-05-06 MED ORDER — FERUMOXYTOL INJECTION 510 MG/17 ML
510.0000 mg | Freq: Once | INTRAVENOUS | Status: AC
  Administered 2017-05-06: 510 mg via INTRAVENOUS
  Filled 2017-05-06: qty 17

## 2017-05-06 MED ORDER — IBUPROFEN 800 MG PO TABS
800.0000 mg | ORAL_TABLET | Freq: Three times a day (TID) | ORAL | Status: DC
  Administered 2017-05-06: 800 mg via ORAL
  Filled 2017-05-06: qty 1

## 2017-05-06 NOTE — Discharge Instructions (Signed)

## 2017-05-06 NOTE — Progress Notes (Signed)
Discharged home in stable condition via w/c. 

## 2017-05-06 NOTE — Discharge Summary (Signed)
Physician Discharge Summary  Patient ID: Kristina Burgess MRN: [id number] DOB/AGE: 06-30-1975 41 y.o.  Admit date: 05/05/2017 Discharge date: 05/06/2017  Admission Diagnoses: Hypotension                   Acute anemia                                         POD # RLTCS  Discharge Diagnoses:  SAA, resolved  Discharged Condition: good  Hospital Course: Pt presented via EMS following syncopal episode at home. Noted in MAU to be hypotensive and severe anemia. CT scan demonstrated blood clot in pelvis but not evidence of active bleeding.  Pt was admitted for blood transfusion and IV fluids. She responded well to treatment.  H & H remained stable after transfusion. Pt progressed to ambulating, voiding, + flatus, tolerating diet and good oral pain control Felt amendable for discharge home with medications and follow as noted.    Consults: None  Significant Diagnostic Studies: labs and CT scan  Treatments: IV hydration and blood transfusion  Discharge Exam: Blood pressure (!) 145/78, pulse 99, temperature 98.3 F (36.8 C), temperature source Oral, resp. rate 18, height 5\' 4"  (1.626 m), weight (!) 155.5 kg (342 lb 12 oz), SpO2 98 %.  Lungs clear Heart RRR Abd soft + BS incision CDI Ext non tender  Disposition: 01-Home or Self Care  Discharge Instructions    Call MD for:  difficulty breathing, headache or visual disturbances   Complete by:  As directed    Call MD for:  extreme fatigue   Complete by:  As directed    Call MD for:  persistant dizziness or light-headedness   Complete by:  As directed    Call MD for:  persistant nausea and vomiting   Complete by:  As directed    Call MD for:  redness, tenderness, or signs of infection (pain, swelling, redness, odor or green/yellow discharge around incision site)   Complete by:  As directed    Call MD for:  severe uncontrolled pain   Complete by:  As directed    Call MD for:  temperature >100.4   Complete by:  As directed    Diet -  low sodium heart healthy   Complete by:  As directed    Sexual acrtivity   Complete by:  As directed    Pelvic rest x 6 weeks     Allergies as of 05/06/2017   No Known Allergies     Medication List    TAKE these medications   ferrous sulfate 325 (65 FE) MG tablet Commonly known as:  FERROUSUL Take 1 tablet (325 mg total) by mouth 2 (two) times daily.   labetalol 100 MG tablet Commonly known as:  NORMODYNE Take 2 tablets (200 mg total) by mouth 2 (two) times daily. What changed:  how much to take   prenatal multivitamin Tabs tablet Take 1 tablet by mouth daily at 12 noon.      Follow-up Information    Landwehr, Dellie BurnsHaley Leilani, MD. Schedule an appointment as soon as possible for a visit.   Specialty:  Obstetrics and Gynecology Contact information: [address] [place] [id number] [phone]           Signed: Hermina StaggersMichael L Dorion Petillo 05/06/2017, 4:44 PM

## 2017-05-06 NOTE — Progress Notes (Signed)
Discharge instructions given and patient verbalized understanding of all instructions provided. Written copy of AVS given to patient. 

## 2017-05-06 NOTE — Plan of Care (Signed)
Pt.will be free from complications

## 2017-05-07 ENCOUNTER — Inpatient Hospital Stay (HOSPITAL_COMMUNITY)
Admission: AD | Admit: 2017-05-07 | Discharge: 2017-05-07 | Disposition: A | Discharge status: Home / Self Care | Payer: BLUE CROSS/BLUE SHIELD | Source: Ambulatory Visit | Attending: Obstetrics & Gynecology | Admitting: Obstetrics & Gynecology

## 2017-05-07 DIAGNOSIS — O902 Hematoma of obstetric wound: Secondary | ICD-10-CM | POA: Diagnosis present

## 2017-05-07 LAB — CBC
HCT: 23.4 % — ABNORMAL LOW (ref 36.0–46.0)
Hemoglobin: 7.9 g/dL — ABNORMAL LOW (ref 12.0–15.0)
MCH: 29.3 pg (ref 26.0–34.0)
MCHC: 33.8 g/dL (ref 30.0–36.0)
MCV: 86.7 fL (ref 78.0–100.0)
Platelets: 170 10*3/uL (ref 150–400)
RBC: 2.7 MIL/uL — ABNORMAL LOW (ref 3.87–5.11)
RDW: 16.3 % — ABNORMAL HIGH (ref 11.5–15.5)
WBC: 7.5 10*3/uL (ref 4.0–10.5)

## 2017-05-07 MED ORDER — SULFAMETHOXAZOLE-TRIMETHOPRIM 800-160 MG PO TABS: 1.0000 | ORAL_TABLET | Freq: Two times a day (BID) | ORAL | 0 refills | Status: AC

## 2017-05-07 NOTE — MAU Note (Signed)
Patient presents with bleeding from C/S incision, was seen in MAU yesterday.

## 2017-05-07 NOTE — MAU Note (Signed)
Dr. Despina HiddenEure irrigated C/S incision patient tolerated well.

## 2017-05-07 NOTE — Discharge Instructions (Signed)
Call your OB/GYN Wednesday am to be seen before the end of next week or, return if needed to [facility]U     Hematoma A hematoma is a collection of blood under the skin, in an organ, in a body space, in a joint space, or in other tissue. The blood can thicken (clot) to form a lump that you can see and feel. The lump is often firm and may become sore and tender. Most hematomas get better in a few days to weeks. However, some hematomas may be serious and require medical care. Hematomas can range from very small to very large. What are the causes? This condition is caused by:  A blunt or penetrating injury.  A leakage from a blood vessel under the skin. This leak happens on its own (is spontaneous) and is more likely to occur in older people, especially those who take blood thinners.  Some medical procedures including surgeries, such as oral surgery, face lifts, and surgeries that involve the joints.  Some medical conditions that cause bleeding or bruising problems. There may be multiple hematomas that appear in different areas of the body.  What are the signs or symptoms? Symptoms of this condition can depend on where the hematoma is located. Common symptoms of a hematoma under the skin include:  A firm lump on the body.  Pain and tenderness in the area.  Bruising. Blue, dark blue, purple-red, or yellowish skin (discoloration) may appear at the site of the hematoma if the hematoma is close to the surface of the skin.  For hematomas in deeper tissues or body spaces, symptoms may be less obvious. A collection of blood in the stomach (intra-abdominal hematoma) may cause pain in the abdomen, weakness, fainting, and shortness of breath. A collection of blood in the head (intracranial hematoma) may cause a headache or symptoms such as weakness, trouble speaking or understanding, or a change in consciousness. How is this diagnosed? This condition is diagnosed based on:  Your medical history.  A  physical exam.  Imaging tests, such as an ultrasonogram or CT scan. These may be needed if your health care provider suspects a hematoma in deeper tissues or body spaces.  Blood tests. These may be needed if your health care provider believes that the hematoma is caused by a medical condition.  How is this treated? This condition usually does not need treatment because many hematomas go away on their own over time. However, large hematomas, or those that may affect vital organs, may need surgical drainage or monitoring. If the hematoma is caused by a medical condition, medicines may be prescribed. Follow these instructions at home: Managing pain, stiffness, and swelling  If directed, apply ice to the affected area: ? Put ice in a plastic bag. ? Place a towel between your skin and the bag. ? Leave the ice on for 20 minutes, 2-3 times a day for the first couple of days.  After applying ice for a couple of days, your health care provider may recommend that you apply warm compresses to the affected area instead. Do this as told by your health care provider. Remove the heat if your skin turns bright red. This is especially important if you are unable to feel pain, heat, or cold. You may have a greater risk of getting burned  Raise (elevate) the affected area above the level of your heart while you are sitting or lying down.  Wrap the affected area with an elastic bandage, if told by your health  care provider. The bandage applies pressure (compression) to the area, which may help to reduce swelling and help the hematoma heal. Make sure the bandage is not wrapped too tight.  If your hematoma is on a leg or foot (lower extremity) and is painful, your health care provider may recommend crutches. Use them as told by your health care provider. General instructions  Take over-the-counter and prescription medicines only as told by your health care provider.  Keep all follow-up visits as told by your  health care provider. This is important. Contact a health care provider if:  You have a fever.  The swelling or discoloration gets worse.  You develop more hematomas. Get help right away if:  Your pain is worse or your pain is not controlled with medicine.  Your skin over the hematoma breaks or starts bleeding.  Your hematoma is in your chest or abdomen and you have weakness, shortness of breath, or a change in consciousness.  You have a hematoma on your scalp caused by a fall or injury and you have a headache that gets worse, trouble speaking or understanding, weakness, or a change in alertness or consciousness. Summary  A hematoma is a collection of blood under the skin, in an organ, in a body space, in a joint space, or in other tissue.  This condition usually does not need treatment because many hematomas go away on their own over time.  Large hematomas, or those that may affect vital organs, may need surgical drainage or monitoring. If the hematoma is caused by a medical condition, medicines may be prescribed. This information is not intended to replace advice given to you by your health care provider. Make sure you discuss any questions you have with your health care provider. Document Released: 12/15/2003 Document Revised: 06/04/2016 Document Reviewed: 06/04/2016 Elsevier Interactive Patient Education  2018 [employer]

## 2017-05-07 NOTE — [facility] Provider Note (Signed)
History     CSN: [id number]  Arrival date and time: 05/07/17 0844   None     Chief Complaint  Patient presents with  . Drainage from Incision   HPI POD #6 repeat C section who was admitted 12/21 and discharged 12/22 with near syncopal episode and hemoglobin 6.9 Received 3 units PRBC and equilibrated to 8.2 and pt felt much better CT scan was negative for intraperitoneal source of bleeding and her vaginal bleeding was normal volume Presented to [facility]U this am with bleeding per incision Reviewed pictures and was certainly some hematoma evacuation but on evaluation here in the MAU no active bleeding just some continued evacuation Hemoglobin now 7.9, I gave pt IV feraheme prior to discharge   Past Medical History:  Diagnosis Date  . Colon polyp    removed 4-5 years ago  . Hypertension   . Infertility associated with anovulation    was on clomid    Past Surgical History:  Procedure Laterality Date  . CESAREAN SECTION  02/28/2011   Procedure: CESAREAN SECTION;  Surgeon: Fortino SicEleanor E Greene, MD;  Location: [facility];  Service: Gynecology;  Laterality: N/A;  . GI polyp removal     4-5 years ago    Family History  Problem Relation Age of Onset  . Heart disease Paternal Grandmother        pacemaker  . Anesthesia problems Neg Hx   . Hypotension Neg Hx   . Malignant hyperthermia Neg Hx   . Pseudochol deficiency Neg Hx     Social History   Tobacco Use  . Smoking status: Never Smoker  . Smokeless tobacco: Never Used  Substance Use Topics  . Alcohol use: No  . Drug use: No    Allergies: No Known Allergies  Medications Prior to Admission  Medication Sig Dispense Refill Last Dose  . docusate sodium (COLACE) 100 MG capsule Take 100 mg by mouth daily as needed for mild constipation.   05/06/2017 at Unknown time  . ibuprofen (ADVIL,MOTRIN) 200 MG tablet Take 600 mg by mouth every 6 (six) hours as needed for mild pain or moderate pain.   05/06/2017 at Unknown time  . labetalol  (NORMODYNE) 100 MG tablet Take 2 tablets (200 mg total) by mouth 2 (two) times daily. 30 tablet 1 05/07/2017 at 0700  . Prenatal Vit-Fe Fumarate-FA (PRENATAL MULTIVITAMIN) TABS tablet Take 1 tablet by mouth daily at 12 noon.   Past Month at Unknown time  . ferrous sulfate (FERROUSUL) 325 (65 FE) MG tablet Take 1 tablet (325 mg total) by mouth 2 (two) times daily. (Patient not taking: Reported on 05/07/2017) 60 tablet 1 Not Taking at Unknown time    Review of Systems Physical Exam   Blood pressure 138/70, pulse (!) 108, temperature 97.7 F (36.5 C), resp. rate 18.  Physical Exam  Abdomen obese soft no evidence of infection She does have some peau d'orange changes in the abdominal wall for the size of the panny and post op healing response but I do not think this is infection but rather lymphatic compression changes At the incision there is a 1 cm opening to the right of midline, steri strips are removed  I use a long stiff urinary in/out catheter and placed into the wound opening and used a 50/50 hydrogen peroxide/saline solution and did about 200 cc of irrigation and the fluid pretty much cleared   MAU Course  Procedures See above MDM   Assessment and Plan  POD #6 Wound hematoma, evacuated  No evidence of wound infection  Because of wound hematoma and the increased chance of infection and the patient's morbid obesity will give a 10 day course of bactrim to prevent a wound infection from developing  [facility] am to schedule a follow up before the end of the week  I informed patient I am oncall Wednesday night and can return if needed then or of course anytime needed  Local care reviewed  Lazaro ArmsLuther H Madora Barletta 05/07/2017, 10:39 AM

## 2019-01-22 IMAGING — CT CT ABD-PELV W/ CM
1 of 3 series · 13 of 32 positions shown, 18 images · IV contrast (OMNIPAQUE)
Comparison: None.

CLINICAL DATA: Right abdominal pain, hypertension, syncope. Status
post C-section on 05/02/2017.

EXAM:
CT ABDOMEN AND PELVIS WITH CONTRAST
TECHNIQUE: Multidetector CT imaging of the abdomen and pelvis was performed
using the standard protocol following bolus administration of
intravenous contrast.
CONTRAST:  100mL [id number] IOPAMIDOL ([id number]) INJECTION 61%

[Series 2: routine abdomen/pelvis with · axial · 0.92mm/px · z∈[+629,+1064]mm · 13 of 101 slices shown, 18 images]
[im 7/101  soft-tissue]
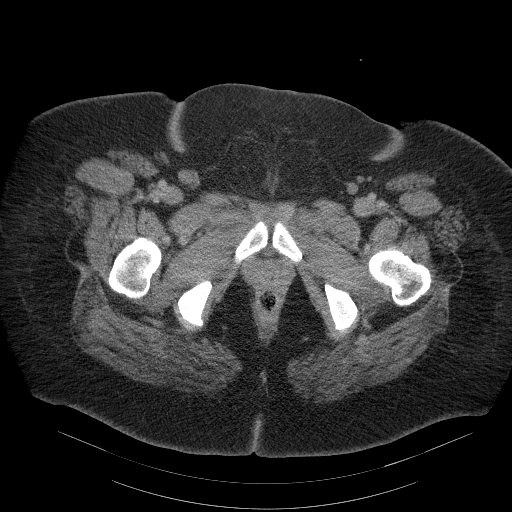
[im 7/101  bone]
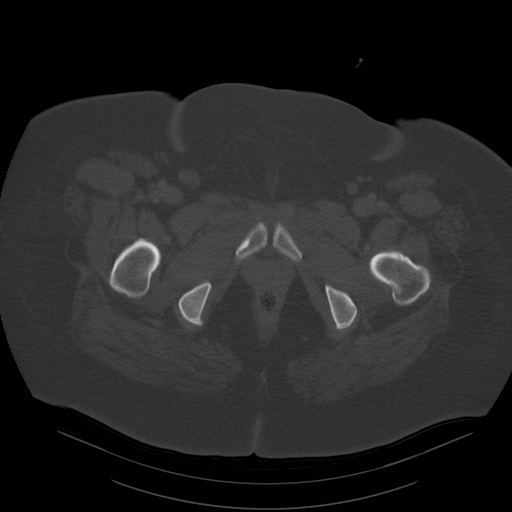
[im 13/101  soft-tissue]
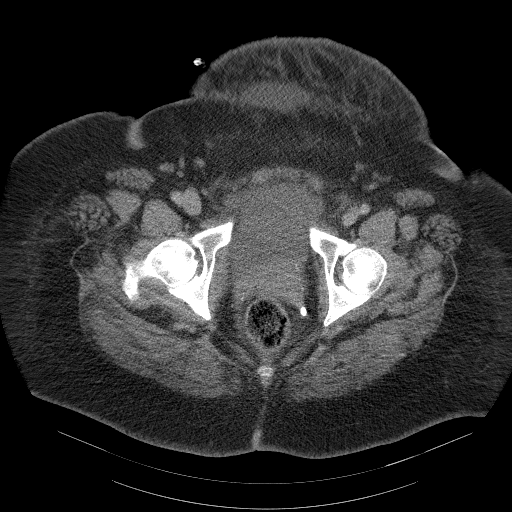
[im 26/101  soft-tissue]
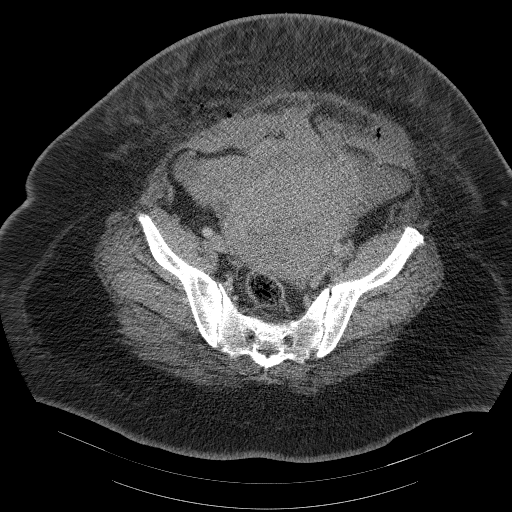
[im 32/101  soft-tissue]
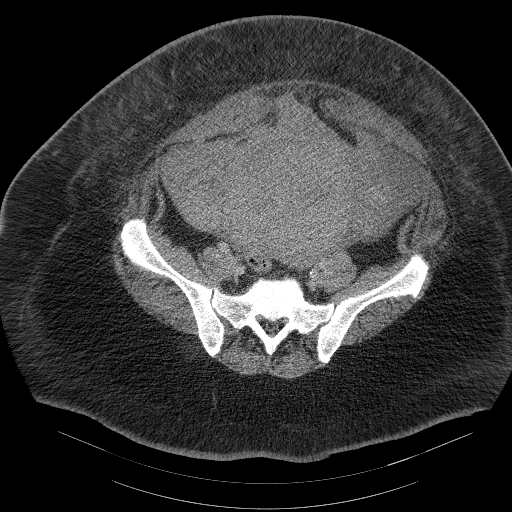
[im 38/101  soft-tissue]
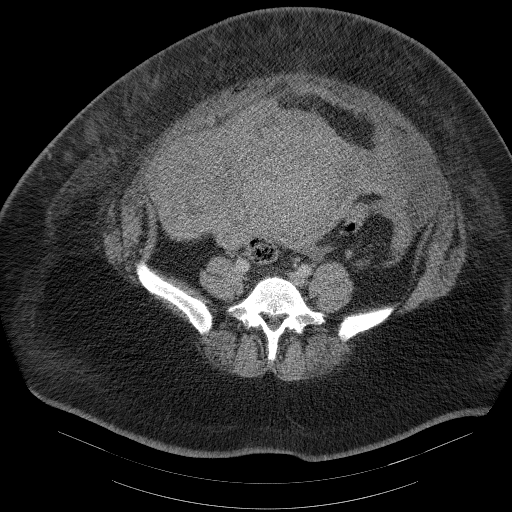
[im 44/101  soft-tissue]
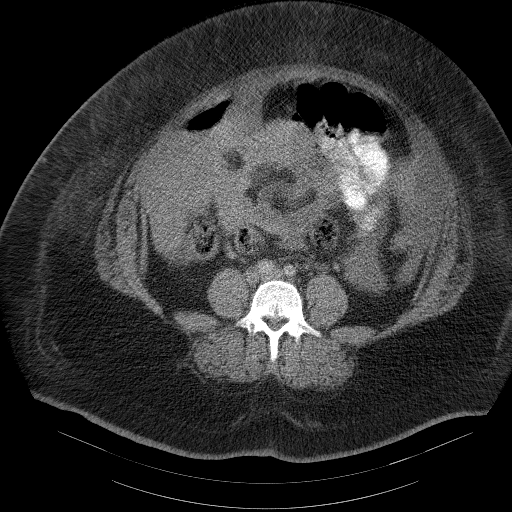
[im 57/101  soft-tissue]
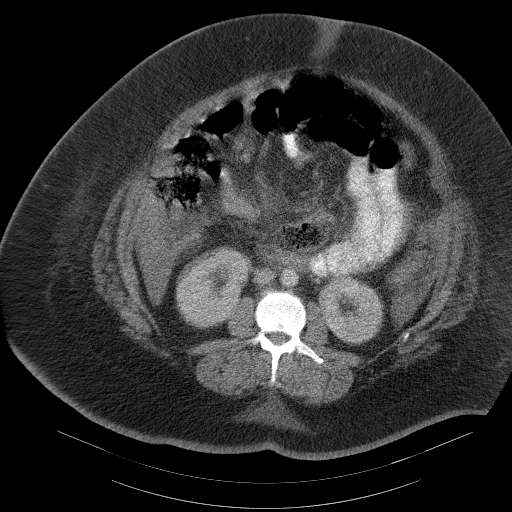
[im 63/101  soft-tissue]
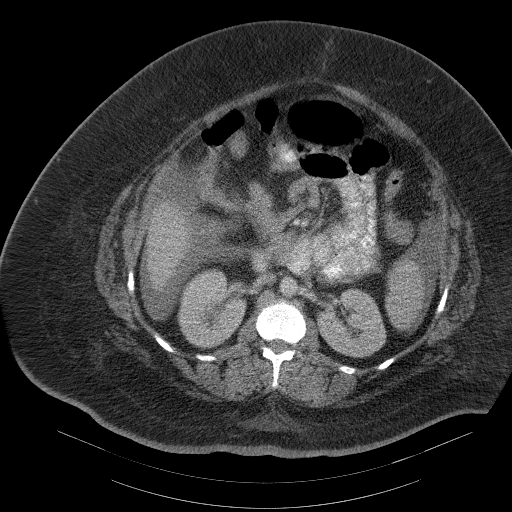
[im 69/101  soft-tissue]
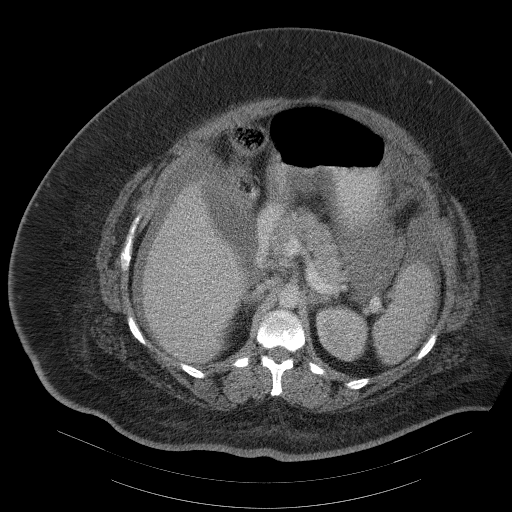
[im 69/101  bone]
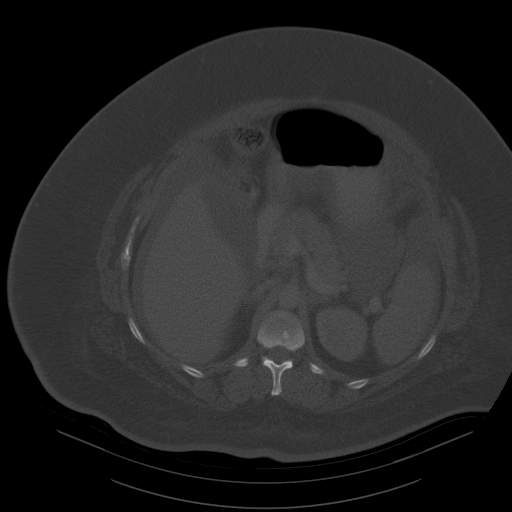
[im 76/101  soft-tissue]
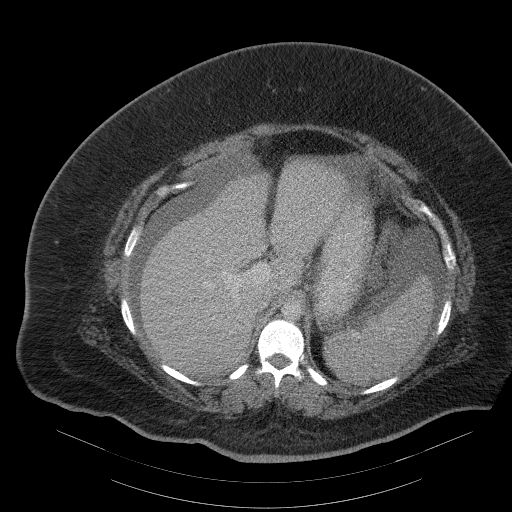
[im 76/101  lung]
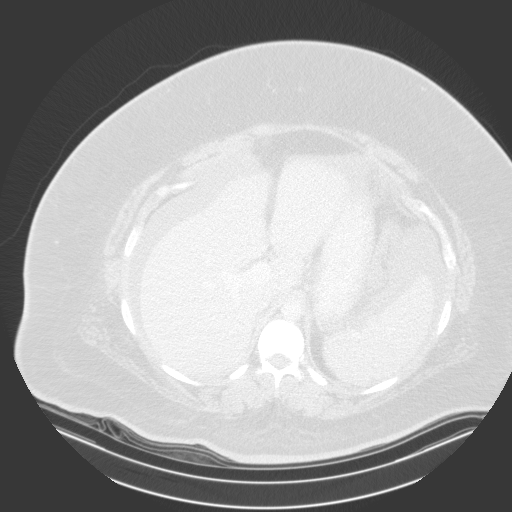
[im 82/101  lung]
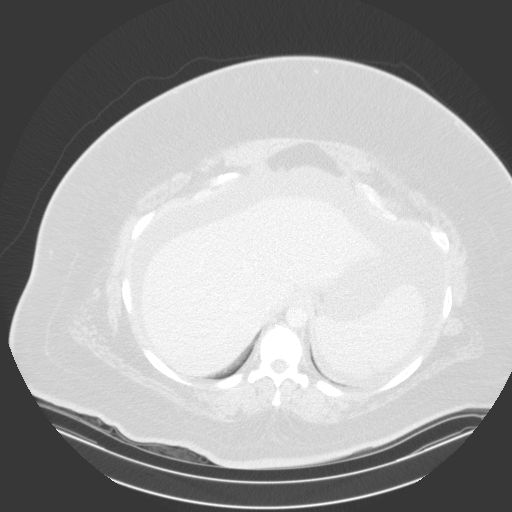
[im 88/101  soft-tissue]
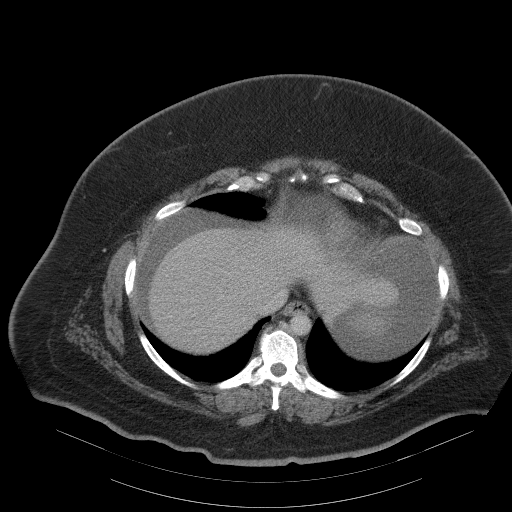
[im 88/101  lung]
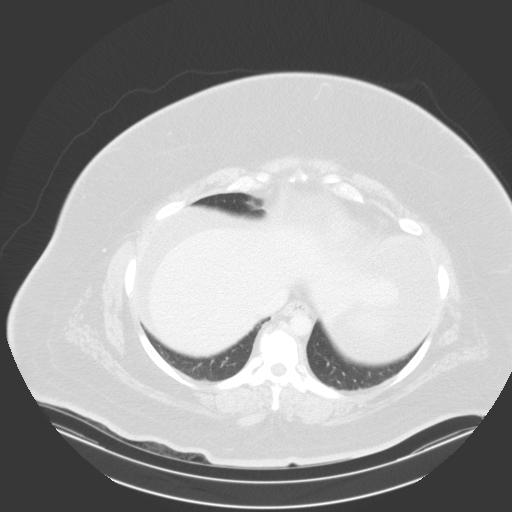
[im 94/101  soft-tissue]
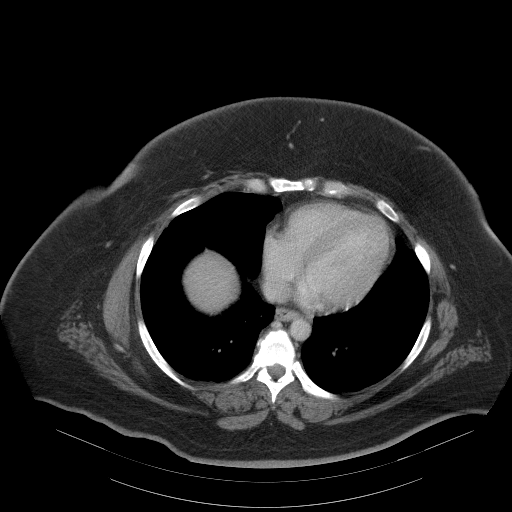
[im 94/101  lung]
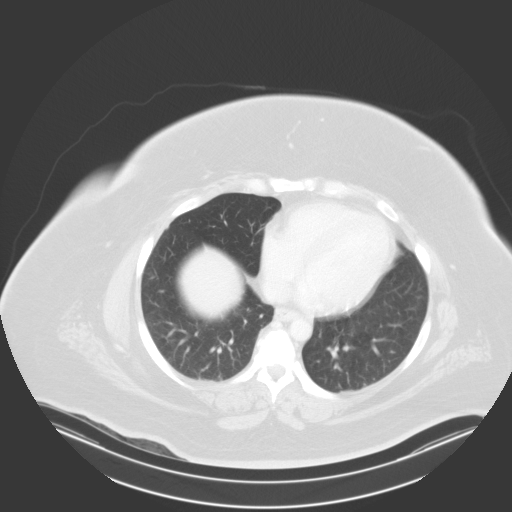

[13 of 32 positions shown; findings below may reference images not displayed]

FINDINGS: Motion degraded images.

Lower chest: Lung bases are clear.

Hepatobiliary: Liver is within normal limits.

Gallbladder is unremarkable. No intrahepatic or extrahepatic duct
dilatation.

Pancreas: Within normal limits.

Spleen: Within normal limits.

Adrenals/Urinary Tract: Adrenal glands are within normal limits.

Kidneys are grossly unremarkable.  No hydronephrosis.

Bladder is within normal limits.

Stomach/Bowel: Stomach is within normal limits.

No evidence of bowel obstruction.

Appendix is not discretely visualized.

Vascular/Lymphatic: No evidence of abdominal aortic aneurysm.

No evidence of active extravasation.

No suspicious abdominopelvic lymphadenopathy.

Reproductive: Recent post gravid uterus.

Fluid/ hemorrhage and tiny foci of gas in the lower uterine segment
(series 2/image 80), likely postprocedural.

Small to moderate extraperitoneal hemorrhage anterior to the uterus
(series 2/ image 84).

Other: Moderate abdominopelvic ascites, with hemorrhage in the right
lower pelvis (series 2/image 70).

Postsurgical changes along the anterior abdominal wall, including
fluid/hemorrhage along the midline low anterior abdominal wall
(series 2/image 79).

Musculoskeletal: Mild degenerative changes of the visualized
thoracolumbar spine.
IMPRESSION: Recent post gravid uterus with postsurgical changes related to
C-section.

Small to moderate abdominopelvic ascites with superimposed
hemorrhage in the right lower pelvis. Additional small to moderate
extraperitoneal hemorrhage along the anterior pelvis.

No evidence of active extravasation.

Additional postsurgical changes along the lower anterior abdominal
wall.
